# Patient Record
Sex: Female | Born: 1990 | Race: Black or African American | Hispanic: No | Marital: Single | Smoking: Never smoker
Health system: Southern US, Community
[De-identification: ages and names within clinical notes are randomized; demographics above are authoritative.]

## PROBLEM LIST (undated history)

## (undated) ENCOUNTER — Emergency Department: Admission: EM | Payer: BC Managed Care – PPO | Source: Home / Self Care

## (undated) ENCOUNTER — Emergency Department (HOSPITAL_COMMUNITY): Payer: Medicaid - Out of State | Source: Home / Self Care

## (undated) DIAGNOSIS — R55 Syncope and collapse: Secondary | ICD-10-CM

## (undated) DIAGNOSIS — J069 Acute upper respiratory infection, unspecified: Secondary | ICD-10-CM

## (undated) HISTORY — DX: Acute upper respiratory infection, unspecified: J06.9

## (undated) HISTORY — PX: BREAST BIOPSY: SHX20

---

## 2016-06-26 ENCOUNTER — Encounter (HOSPITAL_COMMUNITY): Payer: Self-pay

## 2016-06-26 ENCOUNTER — Emergency Department (HOSPITAL_COMMUNITY)
Admission: EM | Admit: 2016-06-26 | Discharge: 2016-06-26 | Disposition: A | Discharge status: Home / Self Care | Payer: Self-pay | Attending: Emergency Medicine | Admitting: Emergency Medicine

## 2016-06-26 DIAGNOSIS — J0110 Acute frontal sinusitis, unspecified: Secondary | ICD-10-CM | POA: Insufficient documentation

## 2016-06-26 HISTORY — DX: Syncope and collapse: R55

## 2016-06-26 MED ORDER — AMOXICILLIN-POT CLAVULANATE 875-125 MG PO TABS: 1.0000 | ORAL_TABLET | Freq: Two times a day (BID) | ORAL | 0 refills | Status: DC

## 2016-06-26 NOTE — Discharge Instructions (Signed)
Follow up with your family doc.  Return for fever, neck pain.

## 2016-06-26 NOTE — ED Triage Notes (Signed)
Pt with headache x 4 days.  Blurred vision and cranial pressure.  States she has episodes in her hx of syncope related to blood pressure being low.  This normally is followed by her headache.  Denies syncopal episode with this event.  States she has been told with any of these symptoms to be seen at a provider.

## 2016-06-26 NOTE — ED Provider Notes (Signed)
[facility]-EMERGENCY DEPT Provider Note   CSN: [id number] Arrival date & time: 06/26/16  1003     History   Chief Complaint Chief Complaint  Patient presents with  . Headache  . Blurred Vision    HPI Lisa Bray is a 26 y.o. female.  26 yo F with headache.  Going on for past week.  Mild change in vision that she describes as blurry. No difficulty reading, driving. Denies head injury, denies congestion, denies fevers, neck pain. Chewing makes this worse.     The history is provided by the patient.  Headache   This is a new problem. The current episode started more than 1 week ago. The problem occurs constantly. The problem has been gradually worsening. The headache is associated with eating. The pain is located in the bilateral region. The pain is at a severity of 6/10. The pain is moderate. The pain does not radiate. Pertinent negatives include no fever, no palpitations, no shortness of breath, no nausea and no vomiting. She has tried nothing for the symptoms. The treatment provided no relief.    Past Medical History:  Diagnosis Date  . Neurogenic syncope     There are no active problems to display for this patient.   History reviewed. No pertinent surgical history.  OB History    No data available       Home Medications    Prior to Admission medications   Medication Sig Start Date End Date Taking? Authorizing Provider  Multiple Vitamins-Minerals (MULTIVITAMIN ADULT PO) Take 1 tablet by mouth daily.   Yes Historical Provider, MD  tretinoin (RETIN-A) 0.025 % cream Apply 1 application topically at bedtime.   Yes Historical Provider, MD    Family History History reviewed. No pertinent family history.  Social History Social History  Substance Use Topics  . Smoking status: Never Smoker  . Smokeless tobacco: Never Used  . Alcohol use No     Allergies   Patient has no known allergies.   Review of Systems Review of Systems  Constitutional: Negative for chills  and fever.  HENT: Negative for congestion and rhinorrhea.   Eyes: Positive for visual disturbance (mild blurring). Negative for redness.  Respiratory: Negative for shortness of breath and wheezing.   Cardiovascular: Negative for chest pain and palpitations.  Gastrointestinal: Negative for nausea and vomiting.  Genitourinary: Negative for dysuria and urgency.  Musculoskeletal: Negative for arthralgias and myalgias.  Skin: Negative for pallor and wound.  Neurological: Positive for headaches. Negative for dizziness.     Physical Exam Updated Vital Signs BP 123/87 (BP Location: Left Arm)   Pulse 87   Temp 98.1 F (36.7 C) (Oral)   Resp 16   LMP 06/11/2016   SpO2 100%   Physical Exam  Constitutional: She is oriented to person, place, and time. She appears well-developed and well-nourished. No distress.  HENT:  Head: Normocephalic and atraumatic.  Swollen turbinates, L frontal sinus tenderness.  Pain to bilateral TMJ.  Eyes: EOM are normal. Pupils are equal, round, and reactive to light.  Neck: Normal range of motion. Neck supple.  Cardiovascular: Normal rate and regular rhythm.  Exam reveals no gallop and no friction rub.   No murmur heard. Pulmonary/Chest: Effort normal. She has no wheezes. She has no rales.  Abdominal: Soft. She exhibits no distension and no mass. There is no tenderness. There is no guarding.  Musculoskeletal: She exhibits no edema or tenderness.  Neurological: She is alert and oriented to person, place, and time. She  has normal strength. No cranial nerve deficit or sensory deficit. She displays a negative Romberg sign. Coordination and gait normal. GCS eye subscore is 4. GCS verbal subscore is 5. GCS motor subscore is 6. She displays no Babinski's sign on the right side. She displays no Babinski's sign on the left side.  Reflex Scores:      Tricep reflexes are 2+ on the right side and 2+ on the left side.      Bicep reflexes are 2+ on the right side and 2+ on the  left side.      Brachioradialis reflexes are 2+ on the right side and 2+ on the left side.      Patellar reflexes are 2+ on the right side and 2+ on the left side.      Achilles reflexes are 2+ on the right side and 2+ on the left side. Ambulates without difficulty  Skin: Skin is warm and dry. She is not diaphoretic.  Psychiatric: She has a normal mood and affect. Her behavior is normal.  Nursing note and vitals reviewed.    ED Treatments / Results  Labs (all labs ordered are listed, but only abnormal results are displayed) Labs Reviewed - No data to display  EKG  EKG Interpretation None       Radiology No results found.  Procedures Procedures (including critical care time)  Medications Ordered in ED Medications - No data to display   Initial Impression / Assessment and Plan / ED Course  I have reviewed the triage vital signs and the nursing notes.  Pertinent labs & imaging results that were available during my care of the patient were reviewed by me and considered in my medical decision making (see chart for details).  Clinical Course     26 yo F with a cc of headache.  Similar to prior, no red flags, benign neuro exam.  Offered migraine cocktail and declining.  Declining pain meds in ED.  TTP about the L frontal sinus with sinus congestion.  Suspect sinusititis also with TMJ pain and worse with eating.  NSAIDs PCP follow up.   11:01 AM:  I have discussed the diagnosis/risks/treatment options with the patient and family and believe the pt to be eligible for discharge home to follow-up with PCP. We also discussed returning to the ED immediately if new or worsening sx occur. We discussed the sx which are most concerning (e.g., sudden worsening pain, fever, inability to tolerate by mouth) that necessitate immediate return. Medications administered to the patient during their visit and any new prescriptions provided to the patient are listed below.  Medications given during  this visit Medications - No data to display   The patient appears reasonably screen and/or stabilized for discharge and I doubt any other medical condition or other [facility] requiring further screening, evaluation, or treatment in the ED at this time prior to discharge.    Final Clinical Impressions(s) / ED Diagnoses   Final diagnoses:  Acute frontal sinusitis, recurrence not specified    New Prescriptions New Prescriptions   No medications on file     Melene Plan, DO 06/26/16 1435

## 2016-07-09 ENCOUNTER — Ambulatory Visit: Payer: BLUE CROSS/BLUE SHIELD | Admitting: Neurology

## 2016-07-29 ENCOUNTER — Encounter: Payer: Self-pay | Admitting: Neurology

## 2016-07-29 ENCOUNTER — Ambulatory Visit (INDEPENDENT_AMBULATORY_CARE_PROVIDER_SITE_OTHER): Payer: Managed Care, Other (non HMO) | Admitting: Neurology

## 2016-07-29 VITALS — Ht 66.0 in | Wt 173.4 lb

## 2016-07-29 DIAGNOSIS — G8929 Other chronic pain: Secondary | ICD-10-CM

## 2016-07-29 DIAGNOSIS — R42 Dizziness and giddiness: Secondary | ICD-10-CM | POA: Diagnosis not present

## 2016-07-29 DIAGNOSIS — R51 Headache with orthostatic component, not elsewhere classified: Secondary | ICD-10-CM

## 2016-07-29 DIAGNOSIS — H539 Unspecified visual disturbance: Secondary | ICD-10-CM | POA: Diagnosis not present

## 2016-07-29 DIAGNOSIS — H5710 Ocular pain, unspecified eye: Secondary | ICD-10-CM | POA: Diagnosis not present

## 2016-07-29 DIAGNOSIS — R519 Headache, unspecified: Secondary | ICD-10-CM

## 2016-07-29 NOTE — Progress Notes (Signed)
[facility]    Provider:  Dr Lucia GaskinsAhern Referring Provider: Assunta FoundSullivan, Emily B, [place] Primary Care Physician:  Assunta FoundSullivan, Emily B, [place]   CC:  Headache  HPI:  Lisa Bray is a 26 y.o. female here as a referral from Dr. Lendell CapriceSullivan for headaches. Started having headaches a month ago and has had severe headache for 2-3 weeks intractable, ibuprofen, tylenol did not help, with associated imbalance, vision changes, eye pain. .She has a hx of headache in the past in 2012 associated with neurogenic syncope and was following with cardiology, diet change helped. This is a completely different headache than in the past. This headaches is more pressure all around the head, pressure behind, head symmetric bilateral but did start on the left and mostly pressure and pain in both eyes. The headaches lasts all day without relief. She was treated for sinus infection but the headaches didn't improve. No inciting event or head trauma. Continuous for weeks. Can be a 10/10 in pain. No light sensitivity, sound sensitivity or nausea or vomiting. Endorses Blurry vision and imbalance. No numbness, tingling, weakness. Nothing has helped, no known triggers.  No neck pain but has back pressure. She didn;t wear a bra for two week due to the back pains. No different activity and no different foods. No stress. She wakes up with headache daily. No other focal neurologic deficits, associated symptoms, inciting events or modifiable factors.  Reviewed notes, labs and imaging from outside physicians, which showed:  Patient was seen in the emergency room for headache, ER prescribed amoxicillin 875 mg. Visual acuity 20/20 left and 20/20 right. She describes the worst headache of her life for 1-2 weeks acute onset, dull with pressure no alleviating factors, no tenderness head or scalp, globally around the head, constant pain without radiation with a 6 out of 10 pain history. She was treated for sinusitis with amoxicillin at the  emergency room when she presented for headache. After completing her course sinus pain pressure has improved but the headache is unchanged. In the thoracic spine she feels pressure like something is pressing on it. Range of motion of the neck is normal without pain on flexion or extension. She describes pounding behind the eyes but also states that the headache is global. She did taking 400 mg of ibuprofen without relief. She was similar headache in 2011 she was diagnosed with neurogenic syncope by a cardiologist and had tilt testing done. No nausea vomiting, no numbness or tingling. Only current medication is topical Retin-A. Patient appeared well exam. Pupils were equally round and reactive, extraocular movements intact, funduscopic exam was normal without abnormalities, no sinus tenderness, tympanic membranes and ear normal, cranial nerves intact, neck was supple, muscle strength was normal and deep tendon reflexes were symmetric, normal sensory and gait. She also described balance difficulty, difficulty concentration and mild blurry vision with the headaches.  Reviewed notes from 2012 patient presented to neurology for evaluation of syncope. She had been having syncope for a year and half at that time. 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 She had been following with cardiology and they discussed a beta blocker with her that this medication was not started. She also has a history of headaches about 2 years prior to her experiencing the syncope. No aura. Located in the bilateral temporal areas characterizes pressure and moderate in intensity. She also reported associated photophobia and headache lasting 30-45 minutes 3-4 times per week. They discussed starting a  beta blocker for headache prophylaxis I would also help with her neurocardiogenic syncope the patient at the time decline. They did start magnesium 400 mg and riboflavin  200 mg daily for headache prophylaxis. I also discussed Imitrex for abortive therapy but patient declined.  Review of Systems: Patient complains of symptoms per HPI as well as the following symptoms:no cp. No sob. Pertinent negatives per HPI. All others negative.   Social History   Social History  . Marital status: Single    Spouse name: N/A  . Number of children: 0  . Years of education: BS   Occupational History  . [employer]    Social History Main Topics  . Smoking status: Never Smoker  . Smokeless tobacco: Never Used  . Alcohol use No  . Drug use: No  . Sexual activity: Not on file   Other Topics Concern  . Not on file   Social History Narrative   Lives at home, single   Right-handed   No caffeine    Family History  Problem Relation Age of Onset  . Hypertension Father   . Glaucoma Father   . Migraines Neg Hx     Past Medical History:  Diagnosis Date  . Neurogenic syncope     Past Surgical History:  Procedure Laterality Date  . BREAST BIOPSY Right     Current Outpatient Prescriptions  Medication Sig Dispense Refill  . ibuprofen (ADVIL,MOTRIN) 200 MG tablet Take 200 mg by mouth every 8 (eight) hours as needed.    . Multiple Vitamins-Minerals (MULTIVITAMIN ADULT PO) Take 1 tablet by mouth daily.    Marland Kitchen tretinoin (RETIN-A) 0.025 % cream Apply 1 application topically at bedtime.     No current facility-administered medications for this visit.     Allergies as of 07/29/2016  . (No Known Allergies)    Vitals: Ht 5\' 6"  (1.676 m)   Wt 173 lb 6.4 oz (78.7 kg)   BMI 27.99 kg/m  Last Weight:  Wt Readings from Last 1 Encounters:  07/29/16 173 lb 6.4 oz (78.7 kg)   Last Height:   Ht Readings from Last 1 Encounters:  07/29/16 5\' 6"  (1.676 m)   Physical exam: Exam: Gen: NAD, conversant, well nourised, well groomed                     CV: RRR, no MRG. No Carotid Bruits. No peripheral edema, warm, nontender Eyes: Conjunctivae clear without exudates or  hemorrhage  Neuro: Detailed Neurologic Exam  Speech:    Speech is normal; fluent and spontaneous with normal comprehension.  Cognition:    The patient is oriented to person, place, and time;     recent and remote memory intact;     language fluent;     normal attention, concentration,     fund of knowledge Cranial Nerves:    The pupils are equal, round, and reactive to light. The fundi are normal and spontaneous venous pulsations are present. Visual fields are full to finger confrontation. Extraocular movements are intact. Trigeminal sensation is intact and the muscles of mastication are normal. The face is symmetric. The palate elevates in the midline. Hearing intact. Voice is normal. Shoulder shrug is normal. The tongue has normal motion without fasciculations.   Coordination:    Normal finger to nose and heel to shin. Normal rapid alternating movements.   Gait:    Heel-toe and tandem gait are normal.   Motor Observation:    No asymmetry, no atrophy, and no  involuntary movements noted. Tone:    Normal muscle tone.    Posture:    Posture is normal. normal erect    Strength:    Strength is V/V in the upper and lower limbs.      Sensation: intact to LT     Reflex Exam:  DTR's:    Deep tendon reflexes in the upper and lower extremities are normal bilaterally.   Toes:    The toes are downgoing bilaterally.   Clonus:    Clonus is absent.       Assessment/Plan:  26 year old with acute onset intractable headache with vision changes, dizziness, wakes he rin the morning  - MRI of the brain w/wo contrast to evaluate for intracranial etiologies such as space-occupying lesion especially given morning headaches.  Discussed the following: To prevent or relieve headaches, try the following: Cool Compress. Lie down and place a cool compress on your head.  Avoid headache triggers. If certain foods or odors seem to have triggered your migraines in the past, avoid them. A headache  diary might help you identify triggers.  Include physical activity in your daily routine. Try a daily walk or other moderate aerobic exercise.  Manage stress. Find healthy ways to cope with the stressors, such as delegating tasks on your to-do list.  Practice relaxation techniques. Try deep breathing, yoga, massage and visualization.  Eat regularly. Eating regularly scheduled meals and maintaining a healthy diet might help prevent headaches. Also, drink plenty of fluids.  Follow a regular sleep schedule. Sleep deprivation might contribute to headaches Consider biofeedback. With this mind-body technique, you learn to control certain bodily functions - such as muscle tension, heart rate and blood pressure - to prevent headaches or reduce headache pain.    Proceed to emergency room if you experience new or worsening symptoms or symptoms do not resolve, if you have new neurologic symptoms or if headache is severe, or for any concerning symptom.   Cc: Assunta Found, [place]   Naomie Dean, MD  [facility] [address] [place], [place] [address]  Phone [phone] Fax [phone]

## 2016-07-29 NOTE — Patient Instructions (Addendum)
Remember to drink plenty of fluid, eat healthy meals and do not skip any meals. Try to eat protein with a every meal and eat a healthy snack such as fruit or nuts in between meals. Try to keep a regular sleep-wake schedule and try to exercise daily, particularly in the form of walking, 20-30 minutes a day, if you can.   As far as diagnostic testing: MRI brain w/wo contrast  I would like to see you back as needed, sooner if we need to. Please call us with any interim questions, concerns, problems, updates or refill requests.   Our phone number is [phone]. We also have an after hours call service for urgent matters and there is a physician on-call for urgent questions. For any emergencies you know to call 911 or go to the nearest emergency room

## 2016-08-26 ENCOUNTER — Other Ambulatory Visit: Payer: Medicaid - Out of State

## 2016-09-07 ENCOUNTER — Other Ambulatory Visit: Payer: Medicaid - Out of State

## 2016-09-19 ENCOUNTER — Emergency Department (HOSPITAL_COMMUNITY)
Admission: EM | Admit: 2016-09-19 | Discharge: 2016-09-19 | Disposition: A | Discharge status: Home / Self Care | Payer: Managed Care, Other (non HMO) | Attending: Emergency Medicine | Admitting: Emergency Medicine

## 2016-09-19 ENCOUNTER — Emergency Department (HOSPITAL_COMMUNITY): Payer: Managed Care, Other (non HMO)

## 2016-09-19 ENCOUNTER — Encounter (HOSPITAL_COMMUNITY): Payer: Self-pay

## 2016-09-19 DIAGNOSIS — J40 Bronchitis, not specified as acute or chronic: Secondary | ICD-10-CM | POA: Diagnosis not present

## 2016-09-19 DIAGNOSIS — R0981 Nasal congestion: Secondary | ICD-10-CM | POA: Diagnosis present

## 2016-09-19 MED ORDER — AZITHROMYCIN 250 MG PO TABS: ORAL_TABLET | ORAL | 0 refills | Status: DC

## 2016-09-19 MED ORDER — FLUTICASONE PROPIONATE 50 MCG/ACT NA SUSP: 2.0000 | Freq: Every day | NASAL | 0 refills | Status: DC

## 2016-09-19 MED ORDER — LORATADINE 10 MG PO TABS: 10.0000 mg | ORAL_TABLET | Freq: Every day | ORAL | 0 refills | Status: DC

## 2016-09-19 MED ORDER — BENZONATATE 100 MG PO CAPS: 100.0000 mg | ORAL_CAPSULE | Freq: Three times a day (TID) | ORAL | 0 refills | Status: DC

## 2016-09-19 NOTE — ED Provider Notes (Signed)
[facility]-EMERGENCY DEPT Provider Note   CSN: [id number] Arrival date & time: 09/19/16  4098     History   Chief Complaint Chief Complaint  Patient presents with  . URI    HPI Lisa Bray is a 26 y.o. female.  HPI Patient presents with 2 weeks of nasal congestion, sinus pressure, productive cough of yellow sputum and sore throat. Denies fever or chills. Has had fatigue and myalgias that this is improved. She been taking over-the-counter cold medication with little improvement. Denies any shortness of breath or chest pain. No new lower extremity swelling or pain. Past Medical History:  Diagnosis Date  . Neurogenic syncope     There are no active problems to display for this patient.   Past Surgical History:  Procedure Laterality Date  . BREAST BIOPSY Right     OB History    No data available       Home Medications    Prior to Admission medications   Medication Sig Start Date End Date Taking? Authorizing Provider  azithromycin (ZITHROMAX Z-PAK) 250 MG tablet 2 po day one, then 1 daily x 4 days 09/19/16   Loren Racer, MD  benzonatate (TESSALON) 100 MG capsule Take 1 capsule (100 mg total) by mouth every 8 (eight) hours. 09/19/16   Loren Racer, MD  fluticasone (FLONASE) 50 MCG/ACT nasal spray Place 2 sprays into both nostrils daily. 09/19/16   Loren Racer, MD  ibuprofen (ADVIL,MOTRIN) 200 MG tablet Take 200 mg by mouth every 8 (eight) hours as needed.    Historical Provider, MD  loratadine (CLARITIN) 10 MG tablet Take 1 tablet (10 mg total) by mouth daily. 09/19/16   Loren Racer, MD  Multiple Vitamins-Minerals (MULTIVITAMIN ADULT PO) Take 1 tablet by mouth daily.    Historical Provider, MD  tretinoin (RETIN-A) 0.025 % cream Apply 1 application topically at bedtime.    Historical Provider, MD    Family History Family History  Problem Relation Age of Onset  . Hypertension Father   . Glaucoma Father   . Migraines Neg Hx     Social History Social  History  Substance Use Topics  . Smoking status: Never Smoker  . Smokeless tobacco: Never Used  . Alcohol use No     Allergies   Patient has no known allergies.   Review of Systems Review of Systems  Constitutional: Positive for fatigue. Negative for chills and fever.  HENT: Positive for congestion, rhinorrhea, sinus pressure and sore throat. Negative for trouble swallowing and voice change.   Eyes: Positive for discharge. Negative for visual disturbance.  Respiratory: Positive for cough. Negative for chest tightness and shortness of breath.   Cardiovascular: Negative for chest pain, palpitations and leg swelling.  Gastrointestinal: Negative for abdominal pain, constipation, diarrhea and nausea.  Musculoskeletal: Positive for myalgias. Negative for back pain, neck pain and neck stiffness.  Skin: Negative for rash and wound.  Neurological: Positive for headaches. Negative for dizziness, weakness, light-headedness and numbness.  All other systems reviewed and are negative.    Physical Exam Updated Vital Signs BP 127/87 (BP Location: Left Arm)   Pulse 85   Temp 97.7 F (36.5 C) (Oral)   Resp 16   LMP 09/15/2016 (Exact Date)   SpO2 100%   Physical Exam  Constitutional: She is oriented to person, place, and time. She appears well-developed and well-nourished. No distress.  HENT:  Head: Normocephalic and atraumatic.  Mouth/Throat: Oropharynx is clear and moist.  Bilateral nasal mucosal edema. No sinus tenderness to percussion.  Mildly erythematous oropharynx. No tonsillar exudates. Uvula is midline.  Eyes: EOM are normal. Pupils are equal, round, and reactive to light.  Neck: Normal range of motion. Neck supple.  No meningismus  Cardiovascular: Normal rate and regular rhythm.  Exam reveals no gallop and no friction rub.   No murmur heard. Pulmonary/Chest: Effort normal and breath sounds normal. No respiratory distress. She has no wheezes. She has no rales. She exhibits no  tenderness.  Abdominal: Soft. Bowel sounds are normal. There is no tenderness. There is no rebound and no guarding.  Musculoskeletal: Normal range of motion. She exhibits no edema or tenderness.  No lower extremity swelling or asymmetry.  Lymphadenopathy:    She has no cervical adenopathy.  Neurological: She is alert and oriented to person, place, and time.  Skin: Skin is warm and dry. Capillary refill takes less than 2 seconds. No rash noted. No erythema.  Psychiatric: She has a normal mood and affect. Her behavior is normal.  Nursing note and vitals reviewed.    ED Treatments / Results  Labs (all labs ordered are listed, but only abnormal results are displayed) Labs Reviewed - No data to display  EKG  EKG Interpretation None       Radiology Dg Chest 2 View  Result Date: 09/19/2016 CLINICAL DATA:  Productive cough for less than a week EXAM: CHEST  2 VIEW COMPARISON:  None. FINDINGS: The heart size and mediastinal contours are within normal limits. Both lungs are clear. The visualized skeletal structures are unremarkable. IMPRESSION: No active cardiopulmonary disease. Electronically Signed   By: Elige Ko   On: 09/19/2016 09:28    Procedures Procedures (including critical care time)  Medications Ordered in ED Medications - No data to display   Initial Impression / Assessment and Plan / ED Course  I have reviewed the triage vital signs and the nursing notes.  Pertinent labs & imaging results that were available during my care of the patient were reviewed by me and considered in my medical decision making (see chart for details).    Chest x-ray is negative. Will treat for sinusitis/bronchitis. Return precautions given.   Final Clinical Impressions(s) / ED Diagnoses   Final diagnoses:  Nasal sinus congestion  Bronchitis    New Prescriptions New Prescriptions   AZITHROMYCIN (ZITHROMAX Z-PAK) 250 MG TABLET    2 po day one, then 1 daily x 4 days   BENZONATATE  (TESSALON) 100 MG CAPSULE    Take 1 capsule (100 mg total) by mouth every 8 (eight) hours.   FLUTICASONE (FLONASE) 50 MCG/ACT NASAL SPRAY    Place 2 sprays into both nostrils daily.   LORATADINE (CLARITIN) 10 MG TABLET    Take 1 tablet (10 mg total) by mouth daily.     Loren Racer, MD 09/19/16 [phone]

## 2016-09-19 NOTE — ED Notes (Signed)
Patient is A & O x4.  She understood AVS instructions.  

## 2016-09-19 NOTE — ED Triage Notes (Signed)
She c/o uri sx x 2 weeks "It just won't go away". She is in no distress.

## 2016-09-23 ENCOUNTER — Ambulatory Visit
Admission: RE | Admit: 2016-09-23 | Discharge: 2016-09-23 | Disposition: A | Discharge status: Home / Self Care | Payer: Managed Care, Other (non HMO) | Source: Ambulatory Visit | Attending: Neurology | Admitting: Neurology

## 2016-09-23 DIAGNOSIS — R42 Dizziness and giddiness: Secondary | ICD-10-CM

## 2016-09-23 DIAGNOSIS — R51 Headache with orthostatic component, not elsewhere classified: Secondary | ICD-10-CM

## 2016-09-23 DIAGNOSIS — H539 Unspecified visual disturbance: Secondary | ICD-10-CM

## 2016-09-23 DIAGNOSIS — G8929 Other chronic pain: Secondary | ICD-10-CM

## 2016-09-23 DIAGNOSIS — H5710 Ocular pain, unspecified eye: Secondary | ICD-10-CM

## 2016-09-23 DIAGNOSIS — R519 Headache, unspecified: Secondary | ICD-10-CM

## 2016-09-23 MED ORDER — GADOBENATE DIMEGLUMINE 529 MG/ML IV SOLN
16.0000 mL | Freq: Once | INTRAVENOUS | Status: AC | PRN
  Administered 2016-09-23: 16 mL via INTRAVENOUS

## 2016-09-28 ENCOUNTER — Telehealth: Payer: Self-pay

## 2016-09-28 NOTE — Telephone Encounter (Signed)
-----   Message from Anson Fret, MD sent at 09/28/2016  9:34 AM EDT ----- MRI brain is unremarkable thanks

## 2016-09-28 NOTE — Telephone Encounter (Signed)
Called pt w/ unremarkable MRI results. Verbalized understanding and appreciation for call. 

## 2017-04-11 ENCOUNTER — Emergency Department (HOSPITAL_COMMUNITY)
Admission: EM | Admit: 2017-04-11 | Discharge: 2017-04-11 | Disposition: A | Discharge status: Home / Self Care | Payer: Managed Care, Other (non HMO) | Attending: Emergency Medicine | Admitting: Emergency Medicine

## 2017-04-11 ENCOUNTER — Encounter (HOSPITAL_COMMUNITY): Payer: Self-pay | Admitting: Emergency Medicine

## 2017-04-11 ENCOUNTER — Emergency Department (HOSPITAL_COMMUNITY): Payer: Managed Care, Other (non HMO)

## 2017-04-11 DIAGNOSIS — K5900 Constipation, unspecified: Secondary | ICD-10-CM

## 2017-04-11 DIAGNOSIS — R110 Nausea: Secondary | ICD-10-CM | POA: Diagnosis present

## 2017-04-11 DIAGNOSIS — Z79899 Other long term (current) drug therapy: Secondary | ICD-10-CM | POA: Diagnosis not present

## 2017-04-11 LAB — COMPREHENSIVE METABOLIC PANEL
ALBUMIN: 3.9 g/dL (ref 3.5–5.0)
ALT: 12 U/L — ABNORMAL LOW (ref 14–54)
ANION GAP: 6 (ref 5–15)
AST: 22 U/L (ref 15–41)
Alkaline Phosphatase: 83 U/L (ref 38–126)
BILIRUBIN TOTAL: 0.5 mg/dL (ref 0.3–1.2)
BUN: 7 mg/dL (ref 6–20)
CO2: 29 mmol/L (ref 22–32)
Calcium: 9.4 mg/dL (ref 8.9–10.3)
Chloride: 104 mmol/L (ref 101–111)
Creatinine, Ser: 0.64 mg/dL (ref 0.44–1.00)
GFR calc Af Amer: >60 mL/min (ref >60)
GFR calc non Af Amer: >60 mL/min (ref >60)
GLUCOSE: 86 mg/dL (ref 65–99)
Potassium: 3.9 mmol/L (ref 3.5–5.1)
SODIUM: 139 mmol/L (ref 135–145)
TOTAL PROTEIN: 7.6 g/dL (ref 6.5–8.1)

## 2017-04-11 LAB — CBC
HEMATOCRIT: 35.1 % — AB (ref 36.0–46.0)
HEMOGLOBIN: 12.4 g/dL (ref 12.0–15.0)
MCH: 26.6 pg (ref 26.0–34.0)
MCHC: 35.3 g/dL (ref 30.0–36.0)
MCV: 75.2 fL — ABNORMAL LOW (ref 78.0–100.0)
Platelets: 196 10*3/uL (ref 150–400)
RBC: 4.67 MIL/uL (ref 3.87–5.11)
RDW: 15 % (ref 11.5–15.5)
WBC: 6.5 10*3/uL (ref 4.0–10.5)

## 2017-04-11 LAB — LIPASE, BLOOD: Lipase: 29 U/L (ref 11–51)

## 2017-04-11 LAB — URINALYSIS, ROUTINE W REFLEX MICROSCOPIC
BILIRUBIN URINE: NEGATIVE
GLUCOSE, UA: NEGATIVE mg/dL
HGB URINE DIPSTICK: NEGATIVE
Ketones, ur: NEGATIVE mg/dL
Leukocytes, UA: NEGATIVE
NITRITE: NEGATIVE
PH: 9 — AB (ref 5.0–8.0)
Protein, ur: NEGATIVE mg/dL
Specific Gravity, Urine: 1.005 (ref 1.005–1.030)

## 2017-04-11 LAB — POC URINE PREG, ED: Preg Test, Ur: NEGATIVE

## 2017-04-11 NOTE — Discharge Instructions (Signed)
Please take miralax twice a day until you have normal bowel movements.  Please follow up with your primary care provider regarding your symptoms.  I have given you instructions on high fiber diet to help your constipation.  You may also take magnesium citrate if you desire a stronger laxative, however this will most likely cause increased cramping.  Also consider using an enema as this can help constipation.   Your labs today were reassuring and your x-ray showed a moderate amount of stool in your large intestine.    If you have any concerns, developed abdominal pain not related to laxative use, or fevers please return to the emergency room for repeat evaluation.

## 2017-04-11 NOTE — ED Triage Notes (Signed)
Pt reports she has had nausea and hard BMs for the past several days. Went to an UC for this and was prescribed an antiemetic and told to take miralax. Started miralax, but did not pick up prescription. Pt reports her symptoms are the same.

## 2017-04-11 NOTE — ED Provider Notes (Signed)
[facility]-EMERGENCY DEPT Provider Note   CSN: [id number] Arrival date & time: 04/11/17  1239     History   Chief Complaint Chief Complaint  Patient presents with  . Nausea  . Constipation    HPI Lisa Bray is a 26 y.o. female with a history of neurogenic syncope and migraines who presents for evaluation of nausea and hard BM for the past few days.  She went to urgent care on friday for this and was started on MiraLAX once daily. She reports that she has had 3 doses of MiraLAX so far. She is still having bowel movements however she reports that they are small and consult consistent with "rabbit pellets."  She is still passing gas. She denies any abdominal pain. She does have intermittent nausea however did not fill her nausea medication prescription from urgent care and says that she does not want any medicine for this. She denies any recent dietary changes. No history of abdominal or pelvic surgeries. No history of pregnancy. She denies any vaginal or urinary symptoms.  HPI  Past Medical History:  Diagnosis Date  . Neurogenic syncope     There are no active problems to display for this patient.   Past Surgical History:  Procedure Laterality Date  . BREAST BIOPSY Right     OB History    No data available       Home Medications    Prior to Admission medications   Medication Sig Start Date End Date Taking? Authorizing Provider  azithromycin (ZITHROMAX Z-PAK) 250 MG tablet 2 po day one, then 1 daily x 4 days 09/19/16   Lisa RacerYelverton, David, MD  benzonatate (TESSALON) 100 MG capsule Take 1 capsule (100 mg total) by mouth every 8 (eight) hours. 09/19/16   Lisa RacerYelverton, David, MD  fluticasone (FLONASE) 50 MCG/ACT nasal spray Place 2 sprays into both nostrils daily. 09/19/16   Lisa RacerYelverton, David, MD  ibuprofen (ADVIL,MOTRIN) 200 MG tablet Take 200 mg by mouth every 8 (eight) hours as needed.    [provider]  loratadine (CLARITIN) 10 MG tablet Take  1 tablet (10 mg total) by mouth daily. 09/19/16   Lisa RacerYelverton, David, MD  Multiple Vitamins-Minerals (MULTIVITAMIN ADULT PO) Take 1 tablet by mouth daily.    [provider]  tretinoin (RETIN-A) 0.025 % cream Apply 1 application topically at bedtime.    [provider]    Family History Family History  Problem Relation Age of Onset  . Hypertension Father   . Glaucoma Father   . Migraines Neg Hx     Social History Social History  Substance Use Topics  . Smoking status: Never Smoker  . Smokeless tobacco: Never Used  . Alcohol use No     Allergies   Patient has no known allergies.   Review of Systems Review of Systems  Constitutional: Negative for chills and fever.  HENT: Negative for ear pain and sore throat.   Eyes: Negative for pain and visual disturbance.  Respiratory: Negative for cough and shortness of breath.   Cardiovascular: Negative for chest pain and palpitations.  Gastrointestinal: Positive for constipation and nausea. Negative for abdominal distention, abdominal pain, blood in stool, rectal pain and vomiting.  Genitourinary: Negative for dysuria and hematuria.  Musculoskeletal: Negative for arthralgias and back pain.  Skin: Negative for color change and rash.  Neurological: Negative for seizures and syncope.  All other systems reviewed and are negative.    Physical Exam Updated Vital Signs BP 108/82 (BP Location: Left  Arm)   Pulse 71   Temp 98.1 F (36.7 C) (Oral)   Resp 13   Ht 5\' 6"  (1.676 m)   Wt 80.3 kg (177 lb)   LMP 03/17/2017 Comment: - upreg 04/11/17  SpO2 99%   BMI 28.57 kg/m   Physical Exam  Constitutional: She appears well-developed and well-nourished. No distress.  HENT:  Head: Normocephalic and atraumatic.  Eyes: Conjunctivae are normal. Right eye exhibits no discharge. Left eye exhibits no discharge. No scleral icterus.  Neck: Normal range of motion.  Cardiovascular: Normal rate, regular rhythm and intact distal  pulses.   Pulmonary/Chest: Effort normal. No stridor. No respiratory distress.  Abdominal: Soft. Normal appearance and bowel sounds are normal. She exhibits no distension and no mass. There is no tenderness. There is no rigidity, no guarding and no CVA tenderness. No hernia.  Musculoskeletal: She exhibits no edema or deformity.  Neurological: She is alert. She exhibits normal muscle tone.  Skin: Skin is warm and dry. She is not diaphoretic.  Psychiatric: She has a normal mood and affect. Her behavior is normal.  Nursing note and vitals reviewed.    ED Treatments / Results  Labs (all labs ordered are listed, but only abnormal results are displayed) Labs Reviewed  COMPREHENSIVE METABOLIC PANEL - Abnormal; Notable for the following:       Result Value   ALT 12 (*)    All other components within normal limits  CBC - Abnormal; Notable for the following:    HCT 35.1 (*)    MCV 75.2 (*)    All other components within normal limits  URINALYSIS, ROUTINE W REFLEX MICROSCOPIC - Abnormal; Notable for the following:    Color, Urine STRAW (*)    pH 9.0 (*)    All other components within normal limits  LIPASE, BLOOD  POC URINE PREG, ED    EKG  EKG Interpretation None       Radiology Dg Abdomen 1 View  Result Date: 04/11/2017 CLINICAL DATA:  Nausea and constipation. EXAM: ABDOMEN - 1 VIEW COMPARISON:  None. FINDINGS: The bowel gas pattern is normal. Mild volume retained large bowel stool. No radio-opaque calculi or other significant radiographic abnormality are seen. Insert phleboliths. IMPRESSION: Moderate amount of retained large bowel stool, normal bowel gas pattern. Electronically Signed   By: Lisa Bray M.D.   On: 04/11/2017 16:36    Procedures Procedures (including critical care time)  Medications Ordered in ED Medications - No data to display   Initial Impression / Assessment and Plan / ED Course  I have reviewed the triage vital signs and the nursing  notes.  Pertinent labs & imaging results that were available during my care of the patient were reviewed by me and considered in my medical decision making (see chart for details).    Lisa Bray presents today for evaluation of nausea and constipation. She reports that her constipation began on Wednesday, was seen in urgent care on Friday, and started on once daily MiraLAX. She reports that she is still passing small amounts of stool and gas. KUB was obtained with out obvious obstruction, moderate retained stool.  Patient was instructed to increase miralax dosing to BID and on supportive care including hydration.  She was instructed she may take magnesium as needed however this may cause abdominal cramping.  She was given return precautions and states her understanding.  Patient was instructed to follow up with her PCP if symptoms persist to evaluate for underlying cause of her symptoms.  She has no abdominal pain and her abdomen is soft and non tender.  I do not feel like she needs additional work up at this time and the miralax needs additional time to work.    Final Clinical Impressions(s) / ED Diagnoses   Final diagnoses:  Constipation, unspecified constipation type  Nausea    New Prescriptions New Prescriptions   No medications on file     Norman Clay 04/11/17 1700    Shaune Pollack, MD 04/12/17 1114

## 2017-07-07 ENCOUNTER — Other Ambulatory Visit: Payer: Self-pay | Admitting: Family Medicine

## 2017-07-07 ENCOUNTER — Other Ambulatory Visit (HOSPITAL_COMMUNITY)
Admission: RE | Admit: 2017-07-07 | Discharge: 2017-07-07 | Disposition: A | Discharge status: Home / Self Care | Payer: Managed Care, Other (non HMO) | Source: Ambulatory Visit | Attending: Family Medicine | Admitting: Family Medicine

## 2017-07-07 DIAGNOSIS — Z01419 Encounter for gynecological examination (general) (routine) without abnormal findings: Secondary | ICD-10-CM | POA: Insufficient documentation

## 2017-07-09 LAB — CYTOLOGY - PAP: Diagnosis: NEGATIVE

## 2017-11-05 ENCOUNTER — Other Ambulatory Visit: Payer: Self-pay | Admitting: General Surgery

## 2017-12-25 ENCOUNTER — Encounter (HOSPITAL_COMMUNITY): Payer: Self-pay

## 2017-12-25 ENCOUNTER — Emergency Department (HOSPITAL_COMMUNITY)
Admission: EM | Admit: 2017-12-25 | Discharge: 2017-12-25 | Disposition: A | Discharge status: Home / Self Care | Payer: Managed Care, Other (non HMO) | Attending: Emergency Medicine | Admitting: Emergency Medicine

## 2017-12-25 ENCOUNTER — Other Ambulatory Visit: Payer: Self-pay

## 2017-12-25 DIAGNOSIS — T7840XA Allergy, unspecified, initial encounter: Secondary | ICD-10-CM

## 2017-12-25 DIAGNOSIS — Z79899 Other long term (current) drug therapy: Secondary | ICD-10-CM | POA: Diagnosis not present

## 2017-12-25 NOTE — ED Triage Notes (Signed)
She states that shortly after consuming a beverage today which contained carrots, sweet potato and watermelon her mouth felt "dry and weird; and it's a little hard to swallow". She denies any feeling of throat,, tongue or lip swelling. Her breathing is normal and her b.b.s. Are clear.

## 2017-12-25 NOTE — ED Provider Notes (Signed)
[facility]-EMERGENCY DEPT Provider Note  CSN: [id number] Arrival date & time: 12/25/17  1803    History   Chief Complaint Chief Complaint  Patient presents with  . Allergic Reaction    HPI Lisa Bray is a 27 y.o. female with a medical history of neurogenic syncope who presented to the ED for a possible allergic reaction. She states that she drank a homemade juice of raw carrot, sweet potato and watermelon. Shortly after consumption (~10 min), she describes her throat feeling dry, itchy and felt like it was hard to swallow. She took Benadryl 50mg  prior to coming to the ED. Denies difficulty breathing, SOB, chest pain, hives, abdominal pain and N/V. She reports environmental allergies to dust and pollen and denies food allergies.   Past Medical History:  Diagnosis Date  . Neurogenic syncope     There are no active problems to display for this patient.   Past Surgical History:  Procedure Laterality Date  . BREAST BIOPSY Right      OB History   None      Home Medications    Prior to Admission medications   Medication Sig Start Date End Date Taking? Authorizing Provider  azithromycin (ZITHROMAX Z-PAK) 250 MG tablet 2 po day one, then 1 daily x 4 days 09/19/16   Loren Racer, MD  benzonatate (TESSALON) 100 MG capsule Take 1 capsule (100 mg total) by mouth every 8 (eight) hours. 09/19/16   Loren Racer, MD  fluticasone (FLONASE) 50 MCG/ACT nasal spray Place 2 sprays into both nostrils daily. 09/19/16   Loren Racer, MD  ibuprofen (ADVIL,MOTRIN) 200 MG tablet Take 200 mg by mouth every 8 (eight) hours as needed.    [provider]  loratadine (CLARITIN) 10 MG tablet Take 1 tablet (10 mg total) by mouth daily. 09/19/16   Loren Racer, MD  Multiple Vitamins-Minerals (MULTIVITAMIN ADULT PO) Take 1 tablet by mouth daily.    [provider]  tretinoin (RETIN-A) 0.025 % cream Apply 1 application topically at bedtime.    [provider]    Family History Family History  Problem Relation Age of Onset  . Hypertension Father   . Glaucoma Father   . Migraines Neg Hx     Social History Social History   Tobacco Use  . Smoking status: Never Smoker  . Smokeless tobacco: Never Used  Substance Use Topics  . Alcohol use: No  . Drug use: No     Allergies   Tape   Review of Systems Review of Systems  Constitutional: Negative for activity change, appetite change and fever.  HENT: Positive for sore throat and trouble swallowing. Negative for voice change.   Eyes: Negative for itching and visual disturbance.  Respiratory: Negative for chest tightness and shortness of breath.   Cardiovascular: Negative for chest pain and palpitations.  Skin: Negative for rash.  Allergic/Immunologic: Positive for environmental allergies. Negative for food allergies.     Physical Exam Updated Vital Signs BP 128/78 (BP Location: Left Arm)   Pulse 70   Temp 98.9 F (37.2 C) (Oral)   Resp 11   LMP 12/22/2017 (Exact Date)   SpO2 100%   Physical Exam  Constitutional: She appears well-developed and well-nourished. No distress.  HENT:  Mouth/Throat: Uvula is midline, oropharynx is clear and moist and mucous membranes are normal. No oropharyngeal exudate, posterior oropharyngeal edema or posterior oropharyngeal erythema. No tonsillar exudate.  Eyes: Pupils are equal, round, and reactive to light. Conjunctivae and EOM are  normal.  Cardiovascular: Normal rate, regular rhythm and normal heart sounds.  Pulmonary/Chest: Effort normal and breath sounds normal.  Lymphadenopathy:    She has no cervical adenopathy.  Skin: Skin is warm and intact. Capillary refill takes less than 2 seconds. No rash noted.  Nursing note and vitals reviewed.    ED Treatments / Results  Labs (all labs ordered are listed, but only abnormal results are displayed) Labs Reviewed - No data to display  EKG None  Radiology No results  found.  Procedures Procedures (including critical care time)  Medications Ordered in ED Medications - No data to display   Initial Impression / Assessment and Plan / ED Course  Triage vital signs and the nursing notes have been reviewed.  Pertinent labs & imaging results that were available during care of the patient were reviewed and considered in medical decision making (see chart for details).   Patient presents approx. 1 hour after experiencing throat itching. She had taken 50mg  of Benadryl prior to arrival and her symptoms had resolved. Physical exam was normal in the ED which is reassuring. History and clinical presentation are not consistent with anaphylaxis or severe reaction that required IM epinephrine or IM/IV antihistamine.  Final Clinical Impressions(s) / ED Diagnoses  1. Allergic Reaction. Not anaphylaxis. Education provided on allergies, anaphylaxis and OTC/pharm therapies used to treat this and when to return to the ED.  Dispo: Home. After thorough clinical evaluation, this patient is determined to be medically stable and can be safely discharged with the previously mentioned treatment and/or outpatient follow-up/referral(s). At this time, there are no other apparent medical conditions that require further screening, evaluation or treatment.   Final diagnoses:  Allergic reaction, initial encounter    ED Discharge Orders    None        Reva BoresMortis, Gabrielle I, PA-C 12/25/17 2009    Arby BarrettePfeiffer, Marcy, MD 12/28/17 1231

## 2017-12-25 NOTE — Discharge Instructions (Addendum)
Avoid drinking that juice again until you can identify what caused this reaction.  Using Benadryl was the exact right thing to do. If something similar happens again, start with Benadryl and see if helps within 30-45 minutes.  If you have more than one or more of these allergic symptoms, then you shouldreturn to the emergency room: difficulty breathing/throat closing sensation, chest pain, shortness of breath, heart racing, sweating, hives, vomiting.

## 2018-10-18 IMAGING — CR DG CHEST 2V
2 series · 2 of 2 positions shown · non-contrast
Comparison: None.

CLINICAL DATA: Productive cough for less than a week

EXAM:
CHEST  2 VIEW

[w chest pa]
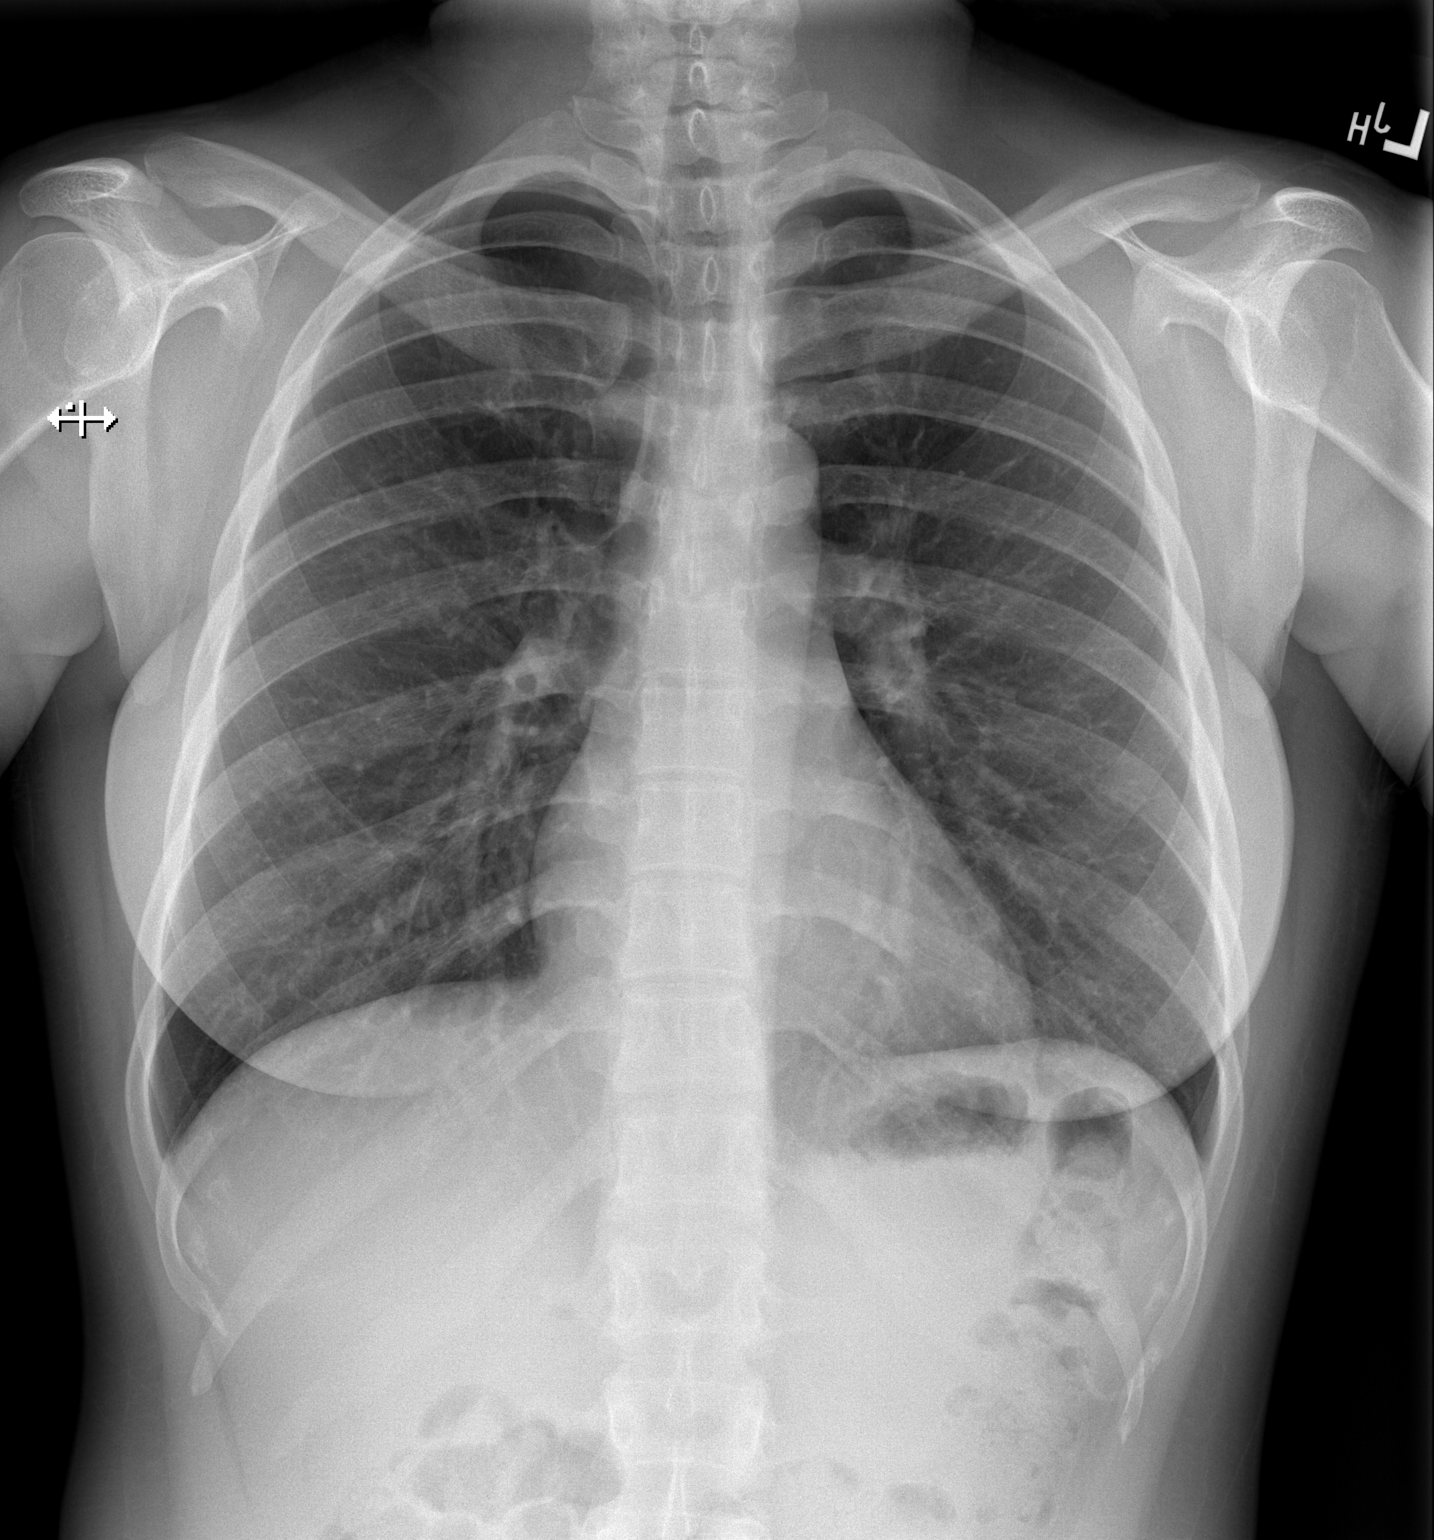

[w chest lat]
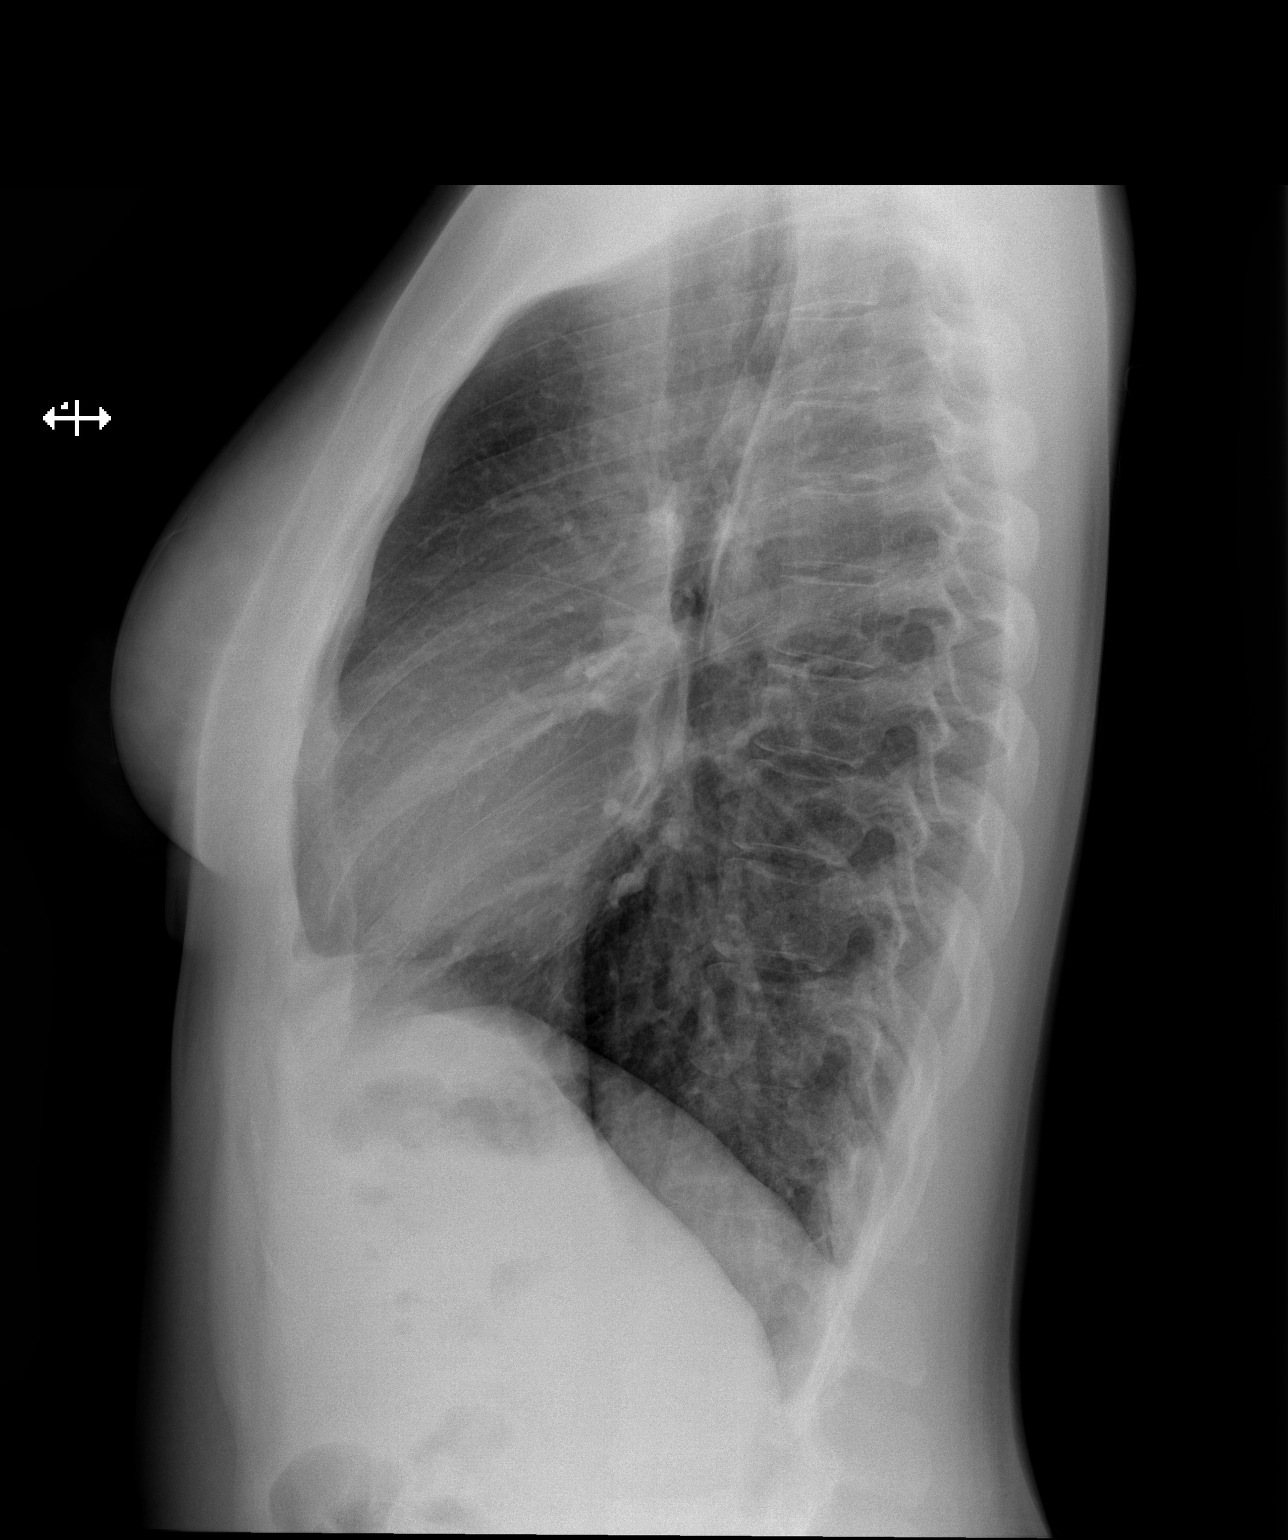

[2 of 2 positions shown; findings below may reference images not displayed]

FINDINGS: The heart size and mediastinal contours are within normal limits.
Both lungs are clear. The visualized skeletal structures are
unremarkable.
IMPRESSION: No active cardiopulmonary disease.

## 2018-12-26 ENCOUNTER — Encounter (HOSPITAL_COMMUNITY): Payer: Self-pay

## 2018-12-26 ENCOUNTER — Other Ambulatory Visit: Payer: Self-pay

## 2018-12-26 ENCOUNTER — Emergency Department (HOSPITAL_COMMUNITY): Payer: Managed Care, Other (non HMO)

## 2018-12-26 ENCOUNTER — Emergency Department (HOSPITAL_COMMUNITY)
Admission: EM | Admit: 2018-12-26 | Discharge: 2018-12-26 | Disposition: A | Discharge status: Home / Self Care | Payer: Managed Care, Other (non HMO) | Attending: Emergency Medicine | Admitting: Emergency Medicine

## 2018-12-26 DIAGNOSIS — M542 Cervicalgia: Secondary | ICD-10-CM | POA: Diagnosis not present

## 2018-12-26 DIAGNOSIS — Z79899 Other long term (current) drug therapy: Secondary | ICD-10-CM | POA: Diagnosis not present

## 2018-12-26 NOTE — Discharge Instructions (Addendum)
Please follow up with your PCP regarding your symptoms.  °

## 2018-12-26 NOTE — ED Triage Notes (Signed)
Patient c/o left lateral neck pain and mid and lower back pain x 3-4 days. Patient denies any heavy lifting or injury. Patient states  She has been waking up with a white tongue and "smelly" x 3-4 days. patient states it smells even after she brushes her teeth.

## 2018-12-26 NOTE — ED Provider Notes (Signed)
[facility] Provider Note   CSN: [id number] Arrival date & time: 12/26/18  1707    History   Chief Complaint Chief Complaint  Patient presents with   Neck Pain   Back Pain    HPI Lisa Bray is a 28 y.o. female who presents to the ED with complaint of left lateral neck pain and mid lower back pain x 3-4 days. Pt also complains of a foul taste in her mouth x 3-4 days as well; she reports when she wakes up her tongue looks white to her; after she brushes her teeth and tongue well the whiteness goes away. She reports that for the past 1.5 weeks she has been feeling "off." She had a headache earlier in the week with some dizziness and nausea; she reports that she was seen by her PCP and noted to have nystagmus at that point; pt was given zofran for nausea and treated for vertigo. She reports that her headache and nausea went away but afterwards she had body aches as well. She was seen by her PCP again via telemedicine for follow up and her PCP ordered an outpatient covid test which came back negative. Pt was told to follow up with her PCP if she did not feel any better but she never did. She states that her body aches have also resolved but now she is having this low back pain and neck pain. Denies fever, chills, chest pain, shortness of breath, sore throat, ear pain, abdominal pain, nausea, vomiting, urinary sx, vaginal sx, or any other associated symptoms. No known injury to pt's back or neck; no overuse injury. No dental pain or drainage from teeth. Pt sees a dentist regularly and last saw them < 6 months ago.        Past Medical History:  Diagnosis Date   Neurogenic syncope     There are no active problems to display for this patient.   Past Surgical History:  Procedure Laterality Date   BREAST BIOPSY Right      OB History   No obstetric history on file.      Home Medications    Prior to Admission medications   Medication Sig  Start Date End Date Taking? Authorizing Provider  azithromycin (ZITHROMAX Z-PAK) 250 MG tablet 2 po day one, then 1 daily x 4 days 09/19/16   Julianne Rice, MD  benzonatate (TESSALON) 100 MG capsule Take 1 capsule (100 mg total) by mouth every 8 (eight) hours. 09/19/16   Julianne Rice, MD  fluticasone (FLONASE) 50 MCG/ACT nasal spray Place 2 sprays into both nostrils daily. 09/19/16   Julianne Rice, MD  ibuprofen (ADVIL,MOTRIN) 200 MG tablet Take 200 mg by mouth every 8 (eight) hours as needed.    [provider]  loratadine (CLARITIN) 10 MG tablet Take 1 tablet (10 mg total) by mouth daily. 09/19/16   Julianne Rice, MD  Multiple Vitamins-Minerals (MULTIVITAMIN ADULT PO) Take 1 tablet by mouth daily.    [provider]  tretinoin (RETIN-A) 0.025 % cream Apply 1 application topically at bedtime.    [provider]    Family History Family History  Problem Relation Age of Onset   Hypertension Father    Glaucoma Father    Migraines Neg Hx     Social History Social History   Tobacco Use   Smoking status: Never Smoker   Smokeless tobacco: Never Used  Substance Use Topics   Alcohol use: No   Drug use: No  Allergies   Tape   Review of Systems Review of Systems  Constitutional: Negative for chills and fever.  HENT: Negative for congestion, ear pain, postnasal drip, rhinorrhea, sinus pressure, sinus pain, sore throat, trouble swallowing and voice change.   Eyes: Negative for visual disturbance.  Respiratory: Negative for shortness of breath.   Cardiovascular: Negative for chest pain.  Gastrointestinal: Negative for abdominal pain, constipation, diarrhea, nausea and vomiting.  Genitourinary: Negative for difficulty urinating.  Musculoskeletal: Positive for back pain and neck pain. Negative for gait problem, myalgias and neck stiffness.  Skin: Negative for rash.  Neurological: Negative for headaches.     Physical Exam Updated Vital  Signs BP 114/80 (BP Location: Right Arm)    Pulse 93    Temp 98.5 F (36.9 C) (Oral)    Resp 16    Ht 5\' 6"  (1.676 m)    Wt 71.7 kg    LMP 12/07/2018    SpO2 100%    BMI 25.50 kg/m   Physical Exam Vitals signs and nursing note reviewed.  Constitutional:      Appearance: She is not ill-appearing or diaphoretic.  HENT:     Head: Normocephalic and atraumatic.     Right Ear: Tympanic membrane normal.     Left Ear: Tympanic membrane normal.     Nose: Nose normal.     Mouth/Throat:     Mouth: Mucous membranes are moist.     Pharynx: No oropharyngeal exudate or posterior oropharyngeal erythema.  Eyes:     Extraocular Movements: Extraocular movements intact.     Conjunctiva/sclera: Conjunctivae normal.     Pupils: Pupils are equal, round, and reactive to light.  Neck:     Musculoskeletal: Normal range of motion and neck supple. No neck rigidity or muscular tenderness.  Cardiovascular:     Rate and Rhythm: Normal rate and regular rhythm.     Pulses: Normal pulses.  Pulmonary:     Effort: Pulmonary effort is normal.     Breath sounds: Normal breath sounds. No wheezing, rhonchi or rales.  Abdominal:     Palpations: Abdomen is soft.     Tenderness: There is no abdominal tenderness. There is no guarding or rebound.  Musculoskeletal:     Right lower leg: No edema.     Left lower leg: No edema.     Comments: No C, T, or L midline spinal tenderness. No paraspinal tenderness to palpation diffusely.   Lymphadenopathy:     Cervical: No cervical adenopathy.  Skin:    General: Skin is warm and dry.  Neurological:     Mental Status: She is alert.     Comments: CN 3-12 grossly intact A&O x4 GCS 15 Sensation and strength intact Coordination with finger-to-nose WNL Neg pronator drift       ED Treatments / Results  Labs (all labs ordered are listed, but only abnormal results are displayed) Labs Reviewed - No data to display  EKG None  Radiology Dg Chest 2 View  Result Date:  12/26/2018 CLINICAL DATA:  Cough, left lateral neck pain and mid lower back pain for 3-4 days EXAM: CHEST - 2 VIEW COMPARISON:  Radiograph 09/19/2016 FINDINGS: The heart size and mediastinal contours are within normal limits. Both lungs are clear. The visualized skeletal structures are unremarkable. IMPRESSION: No active cardiopulmonary disease. Electronically Signed   By: MD Kreg ShropshirePrice  Dehay   On: 12/26/2018 20:11    Procedures Procedures (including critical care time)  Medications Ordered in ED Medications - No  data to display   Initial Impression / Assessment and Plan / ED Course  I have reviewed the triage vital signs and the nursing notes.  Pertinent labs & imaging results that were available during my care of the patient were reviewed by me and considered in my medical decision making (see chart for details).    28 year old female presenting with general complaints of neck pain and low back pain as well as foul taste in mouth. Has had other complaints over the past 1.5 weeks including headache, dizziness, nausea, body aches. Pt was treated for vertigo; headache resolved. She was then tested for covid 19 which was negative. Pt states she does not feel like herself and wants to know what is going on. She did not follow up with her PCP afterwards. She has had no fevers, neck stiffness, rash. Headache has resolved; no meningeal signs, no nystagmus currently. No focal neuro deficits on exam. She reports hx of headaches and has been worked up in the past with both CT scans and MRIs; do not feel patient needs imaging of head today as headache has resolved over 4 days ago. Pt afebrile in the ED today; does not meet SIRS criteria currently. No C, T, or L midline spinal tenderness on exam. No paraspinal tenderness; no decreased ROM. No known injury. No CVA tenderness to suggest UTI vs pyelo. No red flag symptoms. Do not feel patient needs imaging of spine today. Concerning foul taste in mouth; no signs of  dental decay or dental abscess. Oropharynx clear at this time without erythema, edema, or exudate. No post nasal drip. No thrush. No sinus pressure bilaterally. Pt reports hx of sinus infections and states this is different. She is requesting chest x ray at this time; states no cough or shortness of breath but wants to know what is going on. Do not feel patient needs xray at this time but she is adamant; will oblige although do not suspect any abnormalities. Do not feel patient has any symptoms concerning for emergency today; will have her follow up outpatient with PCP.   Xray negative at this time; pt to follow up with her PCP regarding symptoms.        Final Clinical Impressions(s) / ED Diagnoses   Final diagnoses:  Neck pain    ED Discharge Orders    None       Tanda RockersVenter, Carver Murakami, PA-C 12/27/18 0022    Sabas SousBero, Michael M, MD 12/28/18 [phone]

## 2019-02-06 ENCOUNTER — Ambulatory Visit: Payer: Managed Care, Other (non HMO) | Admitting: Allergy

## 2019-02-06 ENCOUNTER — Other Ambulatory Visit: Payer: Self-pay

## 2019-02-06 ENCOUNTER — Encounter: Payer: Self-pay | Admitting: Allergy

## 2019-02-06 VITALS — BP 106/70 | HR 90 | Temp 97.6°F | Resp 16 | Ht 66.0 in | Wt 162.0 lb

## 2019-02-06 DIAGNOSIS — R51 Headache: Secondary | ICD-10-CM | POA: Diagnosis not present

## 2019-02-06 DIAGNOSIS — R21 Rash and other nonspecific skin eruption: Secondary | ICD-10-CM

## 2019-02-06 DIAGNOSIS — J3089 Other allergic rhinitis: Secondary | ICD-10-CM | POA: Diagnosis not present

## 2019-02-06 DIAGNOSIS — G8929 Other chronic pain: Secondary | ICD-10-CM

## 2019-02-06 MED ORDER — FLUTICASONE PROPIONATE 50 MCG/ACT NA SUSP: 2.0000 | Freq: Every day | NASAL | 3 refills | Status: DC

## 2019-02-06 NOTE — Patient Instructions (Addendum)
Today's skin testing showed: Mildly positive to dust mites. Negative to foods.  Get bloodwork and will make additional recommendations based on results.  Start environmental control measures.  Keep track of infections.  Start Flonase 2 sprays daily for 2 weeks to see if headaches get better otherwise use it as needed. Nasal saline spray (i.e., Simply Saline) or nasal saline lavage (i.e., NeilMed) is recommended as needed and prior to medicated nasal sprays. May use over the counter antihistamines such as Zyrtec (cetirizine), Claritin (loratadine), Allegra (fexofenadine), or Xyzal (levocetirizine) daily as needed.  Keep food diary for headaches and foods. If persistent follow up with PCP for the headaches.   Follow up in 3-4 months If yeast infections still a problem, recommend follow up with gynecology.   Control of House Dust Mite Allergen . Dust mite allergens are a common trigger of allergy and asthma symptoms. While they can be found throughout the house, these microscopic creatures thrive in warm, humid environments such as bedding, upholstered furniture and carpeting. . Because so much time is spent in the bedroom, it is essential to reduce mite levels there.  . Encase pillows, mattresses, and box springs in special allergen-proof fabric covers or airtight, zippered plastic covers.  . Bedding should be washed weekly in hot water (130 F) and dried in a hot dryer. Allergen-proof covers are available for comforters and pillows that can't be regularly washed.  Wendee Copp the allergy-proof covers every few months. Minimize clutter in the bedroom. Keep pets out of the bedroom.  Marland Kitchen Keep humidity less than 50% by using a dehumidifier or air conditioning. You can buy a humidity measuring device called a hygrometer to monitor this.  . If possible, replace carpets with hardwood, linoleum, or washable area rugs. If that's not possible, vacuum frequently with a vacuum that has a HEPA filter. . Remove  all upholstered furniture and non-washable window drapes from the bedroom. . Remove all non-washable stuffed toys from the bedroom.  Wash stuffed toys weekly.  Skin care recommendations  Bath time: . Always use lukewarm water. AVOID very hot or cold water. Marland Kitchen Keep bathing time to 5-10 minutes. . Do NOT use bubble bath. . Use a mild soap and use just enough to wash the dirty areas. . Do NOT scrub skin vigorously.  . After bathing, pat dry your skin with a towel. Do NOT rub or scrub the skin.  Moisturizers and prescriptions:  . ALWAYS apply moisturizers immediately after bathing (within 3 minutes). This helps to lock-in moisture. . Use the moisturizer several times a day over the whole body. Kermit Balo summer moisturizers include: Aveeno, CeraVe, Cetaphil. Kermit Balo winter moisturizers include: Aquaphor, Vaseline, Cerave, Cetaphil, Eucerin, Vanicream. . When using moisturizers along with medications, the moisturizer should be applied about one hour after applying the medication to prevent diluting effect of the medication or moisturize around where you applied the medications. When not using medications, the moisturizer can be continued twice daily as maintenance.  Laundry and clothing: . Avoid laundry products with added color or perfumes. . Use unscented hypo-allergenic laundry products such as Tide free, Cheer free & gentle, and All free and clear.  . If the skin still seems dry or sensitive, you can try double-rinsing the clothes. . Avoid tight or scratchy clothing such as wool. . Do not use fabric softeners or dyer sheets.

## 2019-02-06 NOTE — Progress Notes (Signed)
New Patient Note  RE: Lisa Bray MRN: [id number] DOB: 09-Dec-1990 Date of Office Visit: 02/06/2019  Referring provider: Filiberto Pinks Primary care provider: Maude Leriche, PA-C  Chief Complaint: Headache (develops headaches after eating. feels unbalanced alot. her face is breaking out more often, especially where her mask is. she also has been getting frequent vaginal yeast infections and thrush.)  History of Present Illness: I had the pleasure of seeing Salvatore Poe for initial evaluation at the [facility] on 02/07/2019. She is a 28 y.o. female, who is referred here by Scifres, Earlie Server, PA-C for the evaluation of allergy testing.  Rhinitis: She reports symptoms of sinus infections, ear pressure. Symptoms have been going on for 1 years. Anosmia: no. Headache: yes. She has used nasal spray prn with some improvement in symptoms. Sinus infections: 2-3 this year which is treated with antibiotics and has been having more yeast infections. Previous work up includes: 10 years ago which was positive to dust, cockroaches per patient report. Previous ENT evaluation: yes last year for vertigo.  Previous sinus imaging: no. Last eye exam: no. History of nasal polyps: no.  Food/headaches.  Noticed headaches after eating 15-20 minutes. Lasting about 2-3 hours. This occurs on a daily basis. No triggers noted. Worse this past year.  She eats rice, pasta, fruits and vegetables.  Dietary History: patient has been eating other foods including milk, limited eggs, peanut, treenuts, sesame, shellfish, seafood, soy, wheat, meats, fruits and vegetables. No history of migraines but has h/o syncope.  Takes OTC NSAIDS prn with some benefit. Usually takes it a few days out of the week.   Rash: Issues with acne breakouts around the chin area. This is worsening the past year. Concerned if triggered by food. She does wear a mask throughout the day at her job.  No change in  personal care products.   Assessment and Plan: Minaal is a 28 y.o. female with: Other allergic rhinitis Frequent sinus infections and headaches the last 1 year. Tried nasal spray prn with some benefit. Sinusitis treated with antibiotics which has led to frequent yeast infections as well.   Today's skin prick testing showed: Mildly positive to dust mites.  Recommended intradermal testing today but patient would prefer to get bloodwork instead.   Get bloodwork and will make additional recommendations based on results.   Start environmental control measures.  Keep track of sinus infections. If still persistent then will get basic immune evaluation bloodwork. Yeast infections most likely due to disruption of vaginal flora due to antibiotics. If persistent recommend follow up with gynecology.   Start Flonase 2 sprays daily for 2 weeks to see if headaches get better otherwise use it as needed.  Nasal saline spray (i.e., Simply Saline) or nasal saline lavage (i.e., NeilMed) is recommended as needed and prior to medicated nasal sprays.  May use over the counter antihistamines such as Zyrtec (cetirizine), Claritin (loratadine), Allegra (fexofenadine), or Xyzal (levocetirizine) daily as needed.   Chronic nonintractable headache Headaches mainly 15-20 minutes after meals lasting 2-3 hours. No specific food triggers noted. Takes OTC NSAIDs prn with some benefit. No previous diagnosis of migraines. Apparently she had normal labs by PCP recently checking her glucose.   Today's skin prick testing was negative to foods.   Discussed that her headaches may not be related to foods per se.   Keep diary for headaches and foods.  Follow up with PCP for the headaches.   Rash and other nonspecific skin eruption  Worsening rash on the face is most likely acne which is triggered by the new prolonged wearing of the face masks.   Discussed this is not triggered by foods that she is eating.  Discussed  proper skin care.    Return in about 3 months (around 05/09/2019).  Meds ordered this encounter  Medications  . fluticasone (FLONASE) 50 MCG/ACT nasal spray    Sig: Place 2 sprays into both nostrils daily.    Dispense:  16 g    Refill:  3    Lab Orders     Allergens w/Total IgE Area 2  Other allergy screening: Asthma: no Medication allergy: no Hymenoptera allergy: no Urticaria: no Eczema:no History of recurrent infections suggestive of immunodeficency: no  Diagnostics: Skin Testing: Environmental allergy panel and select foods. Positive test to: dust mites. Negative test to: foods as below.  Results discussed with patient/family. Airborne Adult Perc - 02/06/19 0948    Time Antigen Placed  0955    Allergen Manufacturer  Waynette ButteryGreer    Location  Back    Number of Test  59    1. Control-Buffer 50% Glycerol  Negative    2. Control-Histamine 1 mg/ml  2+    3. Albumin saline  Negative    4. Bahia  Negative    5. French Southern Territ[place]ermuda  Negative    6. Johnson  Negative    7. Kentucky Blue  Negative    8. Meadow Fescue  Negative    9. Perennial Rye  Negative    10. Sweet Vernal  Negative    11. Timothy  Negative    12. Cocklebur  Negative    13. Burweed Marshelder  Negative    14. Ragweed, short  Negative    15. Ragweed, Giant  Negative    16. Plantain,  English  Negative    17. Lamb's Quarters  Negative    18. Sheep Sorrell  Negative    19. Rough Pigweed  Negative    20. Marsh Elder, Rough  Negative    21. Mugwort, Common  Negative    22. Ash mix  Negative    23. Birch mix  Negative    24. Beech American  Negative    25. Box, Elder  Negative    26. Cedar, red  Negative    27. Cottonwood, Guinea-BissauEastern  Negative    28. Elm mix  Negative    29. Hickory mix  Negative    30. Maple mix  Negative    31. Oak, Guinea-Bissau[place]astern mix  Negative    32. Pecan Pollen  Negative    33. Pine mix  Negative    34. Sycamore Eastern  Negative    35. Walnut, Black Pollen  Negative    36. Alternaria alternata   Negative    37. Cladosporium Herbarum  Negative    38. Aspergillus mix  Negative    39. Penicillium mix  Negative    40. Bipolaris sorokiniana (Helminthosporium)  Negative    41. Drechslera spicifera (Curvularia)  Negative    42. Mucor plumbeus  Negative    43. Fusarium moniliforme  Negative    44. Aureobasidium pullulans (pullulara)  Negative    45. Rhizopus oryzae  Negative    46. Botrytis cinera  Negative    47. Epicoccum nigrum  Negative    48. Phoma betae  Negative    49. Candida Albicans  Negative    50. Trichophyton mentagrophytes  Negative    51. Mite, D Luanne BrasFarinae  5,000 AU/ml  Negative    52. Mite, D Pteronyssinus  5,000 AU/ml  2+    53. Cat Hair 10,000 BAU/ml  Negative    54.  Dog Epithelia  Negative    55. Mixed Feathers  Negative    56. Horse Epithelia  Negative    57. Cockroach, German  Negative    58. Mouse  Negative    59. Tobacco Leaf  Negative     Food Perc - 02/06/19 0949    Time Antigen Placed  16100955    Allergen Manufacturer  Waynette ButteryGreer    Location  Back    Number of allergen test  10    1. Peanut  Negative    2. Soybean food  Negative    3. Wheat, whole  Negative    4. Sesame  Negative    5. Milk, cow  Negative    6. Egg White, chicken  Negative    7. Casein  Negative    8. Shellfish mix  Negative    9. Fish mix  Negative    10. Cashew  Negative     Food Adult Perc - 02/06/19 0900    Time Antigen Placed  [id number]    Allergen Manufacturer  Waynette ButteryGreer    Location  Back    Number of allergen test  2    34. Rice  Negative    63. Pineapple  Negative       Past Medical History: Patient Active Problem List   Diagnosis Date Noted  . Chronic nonintractable headache 02/07/2019  . Rash and other nonspecific skin eruption 02/07/2019  . Other allergic rhinitis 02/06/2019   Past Medical History:  Diagnosis Date  . Neurogenic syncope   . Recurrent upper respiratory infection (URI)    Past Surgical History: Past Surgical History:  Procedure Laterality Date  .  BREAST BIOPSY Right    Medication List:  Current Outpatient Medications  Medication Sig Dispense Refill  . fluticasone (FLONASE) 50 MCG/ACT nasal spray Place 2 sprays into both nostrils daily. 16 g 0  . ibuprofen (ADVIL,MOTRIN) 200 MG tablet Take 200 mg by mouth every 8 (eight) hours as needed.    . fluticasone (FLONASE) 50 MCG/ACT nasal spray Place 2 sprays into both nostrils daily. 16 g 3   No current facility-administered medications for this visit.    Allergies: No Known Allergies Social History: Social History   Socioeconomic History  . Marital status: Single    Spouse name: Not on file  . Number of children: 0  . Years of education: BS  . Highest education level: Not on file  Occupational History  . Occupation: [employer]  Social Needs  . Financial resource strain: Not on file  . Food insecurity    Worry: Not on file    Inability: Not on file  . Transportation needs    Medical: Not on file    Non-medical: Not on file  Tobacco Use  . Smoking status: Never Smoker  . Smokeless tobacco: Never Used  Substance and Sexual Activity  . Alcohol use: No  . Drug use: No  . Sexual activity: Not on file  Lifestyle  . Physical activity    Days per week: Not on file    Minutes per session: Not on file  . Stress: Not on file  Relationships  . Social Musicianconnections    Talks on phone: Not on file    Gets together: Not on file    Attends religious service: Not  on file    Active member of club or organization: Not on file    Attends meetings of clubs or organizations: Not on file    Relationship status: Not on file  Other Topics Concern  . Not on file  Social History Narrative   Lives at home, single   Right-handed   No caffeine   Lives in a 27 year old apartment. Smoking: no Occupation: Primary school teacherlab tech  Environmental HistorySurveyor, minerals: Water Damage/mildew in the house: yes in the old apartment Carpet in the family room: yes Carpet in the bedroom: yes Heating: electric Cooling: central  Pet: no  Family History: Family History  Problem Relation Age of Onset  . Hypertension Father   . Glaucoma Father   . Migraines Neg Hx    Problem                               Relation Asthma                                   Brother  Eczema                                No  Food allergy                          No  Allergic rhino conjunctivitis     No   Review of Systems  Constitutional: Negative for appetite change, chills, fever and unexpected weight change.  HENT: Negative for congestion and rhinorrhea.   Eyes: Negative for itching.  Respiratory: Negative for cough, chest tightness, shortness of breath and wheezing.   Cardiovascular: Negative for chest pain.  Gastrointestinal: Negative for abdominal pain.  Genitourinary: Negative for difficulty urinating.  Skin: Positive for rash.  Allergic/Immunologic: Positive for environmental allergies. Negative for food allergies.  Neurological: Positive for headaches.   Objective: BP 106/70 (BP Location: Left Arm, Patient Position: Sitting, Cuff Size: Normal)   Pulse 90   Temp 97.6 F (36.4 C) (Temporal)   Resp 16   Ht 5\' 6"  (1.676 m)   Wt 162 lb (73.5 kg)   SpO2 99%   BMI 26.15 kg/m  Body mass index is 26.15 kg/m. Physical Exam  Constitutional: She is oriented to person, place, and time. She appears well-developed and well-nourished.  HENT:  Head: Normocephalic and atraumatic.  Right Ear: External ear normal.  Left Ear: External ear normal.  Nose: Nose normal.  Mouth/Throat: Oropharynx is clear and moist.  Eyes: Conjunctivae and EOM are normal.  Neck: Neck supple.  Cardiovascular: Normal rate, regular rhythm and normal heart sounds. Exam reveals no gallop and no friction rub.  No murmur heard. Pulmonary/Chest: Effort normal and breath sounds normal. She has no wheezes. She has no rales.  Abdominal: Soft.  Neurological: She is alert and oriented to person, place, and time.  Skin: Skin is warm. Rash noted.  Acne on  the lower half of the face.   Psychiatric: She has a normal mood and affect. Her behavior is normal.  Nursing note and vitals reviewed.  The plan was reviewed with the patient/family, and all questions/concerned were addressed.  It was my pleasure to see Karolee OhsWyoshia today and participate in her care. Please feel free to contact me with any questions or  concerns.  Sincerely,  Rexene Alberts, DO Allergy & Immunology  [facility] office: [phone] [facility] office: [place] office: [phone]

## 2019-02-07 DIAGNOSIS — G8929 Other chronic pain: Secondary | ICD-10-CM | POA: Insufficient documentation

## 2019-02-07 DIAGNOSIS — R21 Rash and other nonspecific skin eruption: Secondary | ICD-10-CM | POA: Insufficient documentation

## 2019-02-07 NOTE — Assessment & Plan Note (Signed)
Headaches mainly 15-20 minutes after meals lasting 2-3 hours. No specific food triggers noted. Takes OTC NSAIDs prn with some benefit. No previous diagnosis of migraines. Apparently she had normal labs by PCP recently checking her glucose.   Today's skin prick testing was negative to foods.   Discussed that her headaches may not be related to foods per se.   Keep diary for headaches and foods.  Follow up with PCP for the headaches.

## 2019-02-07 NOTE — Assessment & Plan Note (Signed)
Frequent sinus infections and headaches the last 1 year. Tried nasal spray prn with some benefit. Sinusitis treated with antibiotics which has led to frequent yeast infections as well.   Today's skin prick testing showed: Mildly positive to dust mites.  Recommended intradermal testing today but patient would prefer to get bloodwork instead.   Get bloodwork and will make additional recommendations based on results.   Start environmental control measures.  Keep track of sinus infections. If still persistent then will get basic immune evaluation bloodwork. Yeast infections most likely due to disruption of vaginal flora due to antibiotics. If persistent recommend follow up with gynecology.   Start Flonase 2 sprays daily for 2 weeks to see if headaches get better otherwise use it as needed.  Nasal saline spray (i.e., Simply Saline) or nasal saline lavage (i.e., NeilMed) is recommended as needed and prior to medicated nasal sprays.  May use over the counter antihistamines such as Zyrtec (cetirizine), Claritin (loratadine), Allegra (fexofenadine), or Xyzal (levocetirizine) daily as needed.

## 2019-02-07 NOTE — Assessment & Plan Note (Signed)
Worsening rash on the face is most likely acne which is triggered by the new prolonged wearing of the face masks.   Discussed this is not triggered by foods that she is eating.  Discussed proper skin care.

## 2019-02-08 LAB — ALLERGENS W/TOTAL IGE AREA 2
Alternaria Alternata IgE: <0.1 kU/L
Aspergillus Fumigatus IgE: <0.1 kU/L
Bermuda Grass IgE: <0.1 kU/L
Cat Dander IgE: <0.1 kU/L
Cedar, Mountain IgE: <0.1 kU/L
Cladosporium Herbarum IgE: <0.1 kU/L
Cockroach, German IgE: <0.1 kU/L
Common Silver Birch IgE: <0.1 kU/L
Cottonwood IgE: <0.1 kU/L
D Farinae IgE: <0.1 kU/L
D Pteronyssinus IgE: 0.2 kU/L — AB
Dog Dander IgE: <0.1 kU/L
Elm, American IgE: <0.1 kU/L
IgE (Immunoglobulin E), Serum: 25 IU/mL (ref 6–495)
Johnson Grass IgE: <0.1 kU/L
Maple/Box Elder IgE: <0.1 kU/L
Mouse Urine IgE: <0.1 kU/L
Oak, White IgE: <0.1 kU/L
Pecan, Hickory IgE: <0.1 kU/L
Penicillium Chrysogen IgE: <0.1 kU/L
Pigweed, Rough IgE: <0.1 kU/L
Ragweed, Short IgE: <0.1 kU/L
Sheep Sorrel IgE Qn: <0.1 kU/L
Timothy Grass IgE: <0.1 kU/L
White Mulberry IgE: <0.1 kU/L

## 2019-05-10 ENCOUNTER — Ambulatory Visit: Payer: Managed Care, Other (non HMO) | Admitting: Allergy

## 2019-05-15 ENCOUNTER — Other Ambulatory Visit: Payer: Self-pay

## 2019-05-15 DIAGNOSIS — Z20822 Contact with and (suspected) exposure to covid-19: Secondary | ICD-10-CM

## 2019-05-17 LAB — NOVEL CORONAVIRUS, NAA: SARS-CoV-2, NAA: NOT DETECTED

## 2019-06-09 ENCOUNTER — Other Ambulatory Visit (HOSPITAL_COMMUNITY): Payer: Self-pay | Admitting: Neurology

## 2019-06-09 ENCOUNTER — Other Ambulatory Visit: Payer: Self-pay | Admitting: Neurology

## 2019-06-09 DIAGNOSIS — R9082 White matter disease, unspecified: Secondary | ICD-10-CM

## 2019-06-21 ENCOUNTER — Other Ambulatory Visit: Payer: Self-pay

## 2019-06-21 ENCOUNTER — Ambulatory Visit
Admission: RE | Admit: 2019-06-21 | Discharge: 2019-06-21 | Disposition: A | Discharge status: Home / Self Care | Payer: Managed Care, Other (non HMO) | Source: Ambulatory Visit | Attending: Neurology | Admitting: Neurology

## 2019-06-21 DIAGNOSIS — R9082 White matter disease, unspecified: Secondary | ICD-10-CM | POA: Diagnosis present

## 2019-06-21 MED ORDER — GADOBUTROL 1 MMOL/ML IV SOLN
7.0000 mL | Freq: Once | INTRAVENOUS | Status: AC | PRN
  Administered 2019-06-21: 09:00:00 7 mL via INTRAVENOUS

## 2019-07-28 ENCOUNTER — Other Ambulatory Visit: Payer: Self-pay

## 2019-07-28 ENCOUNTER — Emergency Department (HOSPITAL_COMMUNITY)
Admission: EM | Admit: 2019-07-28 | Discharge: 2019-07-28 | Disposition: A | Discharge status: Home / Self Care | Payer: Managed Care, Other (non HMO) | Attending: Emergency Medicine | Admitting: Emergency Medicine

## 2019-07-28 ENCOUNTER — Encounter (HOSPITAL_COMMUNITY): Payer: Self-pay

## 2019-07-28 DIAGNOSIS — Z0489 Encounter for examination and observation for other specified reasons: Secondary | ICD-10-CM | POA: Diagnosis present

## 2019-07-28 DIAGNOSIS — Z03818 Encounter for observation for suspected exposure to other biological agents ruled out: Secondary | ICD-10-CM | POA: Diagnosis not present

## 2019-07-28 DIAGNOSIS — Z79899 Other long term (current) drug therapy: Secondary | ICD-10-CM | POA: Insufficient documentation

## 2019-07-28 LAB — CBC WITH DIFFERENTIAL/PLATELET
Abs Immature Granulocytes: 0.01 10*3/uL (ref 0.00–0.07)
Basophils Absolute: 0 10*3/uL (ref 0.0–0.1)
Basophils Relative: 1 %
Eosinophils Absolute: 0.1 10*3/uL (ref 0.0–0.5)
Eosinophils Relative: 2 %
HCT: 35.8 % — ABNORMAL LOW (ref 36.0–46.0)
Hemoglobin: 12.4 g/dL (ref 12.0–15.0)
Immature Granulocytes: 0 %
Lymphocytes Relative: 29 %
Lymphs Abs: 1.9 10*3/uL (ref 0.7–4.0)
MCH: 27.1 pg (ref 26.0–34.0)
MCHC: 34.6 g/dL (ref 30.0–36.0)
MCV: 78.2 fL — ABNORMAL LOW (ref 80.0–100.0)
Monocytes Absolute: 0.6 10*3/uL (ref 0.1–1.0)
Monocytes Relative: 8 %
Neutro Abs: 4 10*3/uL (ref 1.7–7.7)
Neutrophils Relative %: 60 %
Platelets: 197 10*3/uL (ref 150–400)
RBC: 4.58 MIL/uL (ref 3.87–5.11)
RDW: 15.5 % (ref 11.5–15.5)
WBC: 6.7 10*3/uL (ref 4.0–10.5)
nRBC: 0 % (ref 0.0–0.2)

## 2019-07-28 LAB — BASIC METABOLIC PANEL
Anion gap: 7 (ref 5–15)
BUN: 10 mg/dL (ref 6–20)
CO2: 28 mmol/L (ref 22–32)
Calcium: 9.2 mg/dL (ref 8.9–10.3)
Chloride: 105 mmol/L (ref 98–111)
Creatinine, Ser: 0.61 mg/dL (ref 0.44–1.00)
GFR calc Af Amer: >60 mL/min (ref >60)
GFR calc non Af Amer: >60 mL/min (ref >60)
Glucose, Bld: 84 mg/dL (ref 70–99)
Potassium: 3.5 mmol/L (ref 3.5–5.1)
Sodium: 140 mmol/L (ref 135–145)

## 2019-07-28 NOTE — ED Provider Notes (Signed)
[facility]-EMERGENCY DEPT Provider Note   CSN: [id number] Arrival date & time: 07/28/19  1449     History Chief Complaint  Patient presents with  . possible infection    Lisa Bray is a 29 y.o. female.  HPI Patient reports she is concerned that she has developed an overgrowth of yeast and other bacteria because she had been on quite a few antibiotics for her sinuses for a period of time.  Her current symptoms started in the summer about August.  She reports she was getting quite a few yeast infections for period of time they did get treated with topical over-the-counter antifungals.  She is not having symptoms of yeast infection right now.  She reports however she also got symptoms of thrush several times.  She was treated for it with oral medications.  She still is periodically getting some white plaque in the mouth.  Although there is none currently.  No sore throat currently.  She is concerned for residual or persistent fungal infection.  She would like to know if there is some specific testing that could be done.  Patient reports she has been seen by ENT and GI.  GI reported that her symptom of some white, foamy material that she gets in the back of her throat sounded more like silent reflux.  She was started on a reflux medication but does not feel like it changed symptoms.    Past Medical History:  Diagnosis Date  . Neurogenic syncope   . Recurrent upper respiratory infection (URI)     Patient Active Problem List   Diagnosis Date Noted  . Chronic nonintractable headache 02/07/2019  . Rash and other nonspecific skin eruption 02/07/2019  . Other allergic rhinitis 02/06/2019    Past Surgical History:  Procedure Laterality Date  . BREAST BIOPSY Right      OB History   No obstetric history on file.     Family History  Problem Relation Age of Onset  . Hypertension Father   . Glaucoma Father   . Migraines Neg Hx     Social History   Tobacco  Use  . Smoking status: Never Smoker  . Smokeless tobacco: Never Used  Substance Use Topics  . Alcohol use: No  . Drug use: No    Home Medications Prior to Admission medications   Medication Sig Start Date End Date Taking? Authorizing Provider  fluticasone (FLONASE) 50 MCG/ACT nasal spray Place 2 sprays into both nostrils daily. Patient taking differently: Place 2 sprays into both nostrils daily as needed for allergies.  02/06/19  Yes Ellamae Sia, DO  fluticasone (FLONASE) 50 MCG/ACT nasal spray Place 2 sprays into both nostrils daily. Patient not taking: Reported on 07/28/2019 09/19/16   Loren Racer, MD    Allergies    Patient has no known allergies.  Review of Systems   Review of Systems  Physical Exam Updated Vital Signs BP 129/78   Pulse 79   Temp 98 F (36.7 C) (Oral)   Resp 14   Ht 5\' 6"  (1.676 m)   Wt 72.6 kg   LMP 06/20/2019   SpO2 100%   BMI 25.82 kg/m   Physical Exam  ED Results / Procedures / Treatments   Labs (all labs ordered are listed, but only abnormal results are displayed) Labs Reviewed  CBC WITH DIFFERENTIAL/PLATELET - Abnormal; Notable for the following components:      Result Value   HCT 35.8 (*)    MCV 78.2 (*)  All other components within normal limits  BASIC METABOLIC PANEL    EKG None  Radiology No results found.  Procedures Procedures (including critical care time)  Medications Ordered in ED Medications - No data to display  ED Course  I have reviewed the triage vital signs and the nursing notes.  Pertinent labs & imaging results that were available during my care of the patient were reviewed by me and considered in my medical decision making (see chart for details).    MDM Rules/Calculators/A&P                      Patient clinically well.  No objective findings of thrush at this time.  Basic labs rule out diabetes or concerning anomaly in CBC with differential.  By physical exam and review of systems does not appear  to have significant risk for underlying autoimmune disorder.  Vital signs normal.  Physical exam normal.  At this time recommend patient proceed with recommendations per GI for continuing antireflux medication.  We reviewed some of the more atypical symptoms that are associated with reflux.  Also recommend that at time when symptoms are present with any oral lesions, try to see her dermatologist.  Patient is seen on a regular basis by dermatology for management of adult onset acne. Final Clinical Impression(s) / ED Diagnoses Final diagnoses:  Encounter for patient concern about exposure to infectious organism    Rx / DC Orders ED Discharge Orders    None       Charlesetta Shanks, MD 07/31/19 1513

## 2019-07-28 NOTE — Discharge Instructions (Addendum)
1.  Your lab work does not show abnormalities concerning for diabetes or significant infection. 2.  You describe episodes of oral thrush although today, the condition of the mouth and teeth is very good.  Try to have your dermatologist examined your tongue and mouth when you are actually having symptoms. 3.  I do recommend you continue medication for reflux as prescribed.  If you are continuing to have significant symptoms of white foamy material coming up in your throat and mouth, you should follow-up with the gastroenterologist again and discuss other ways of identifying and diagnosing reflux.

## 2019-07-28 NOTE — ED Triage Notes (Signed)
Patient reports last year she was on antibiotics sinus infections. Patient states due to antibiotics she developed a lot of yeast infections and had to take more medications. Patient states since August 2020 she has been "spitting up" white foamy substance. Patient states this happens constantly and she thinks her microbiome is off due to the excess usage of antibiotics last year. Patient states her pcp sent her to GI and they told her it was silent reflux, pcp also sent her to ENT and did not have any findings there.

## 2020-01-30 ENCOUNTER — Other Ambulatory Visit: Payer: Self-pay | Admitting: Neurology

## 2020-01-30 DIAGNOSIS — R930 Abnormal findings on diagnostic imaging of skull and head, not elsewhere classified: Secondary | ICD-10-CM

## 2020-02-05 ENCOUNTER — Other Ambulatory Visit: Payer: Self-pay | Admitting: Neurology

## 2020-02-05 DIAGNOSIS — R9082 White matter disease, unspecified: Secondary | ICD-10-CM

## 2020-02-05 DIAGNOSIS — R930 Abnormal findings on diagnostic imaging of skull and head, not elsewhere classified: Secondary | ICD-10-CM

## 2020-02-06 ENCOUNTER — Other Ambulatory Visit: Payer: Self-pay | Admitting: Neurology

## 2020-02-06 DIAGNOSIS — R930 Abnormal findings on diagnostic imaging of skull and head, not elsewhere classified: Secondary | ICD-10-CM

## 2020-02-15 ENCOUNTER — Ambulatory Visit: Payer: Managed Care, Other (non HMO)

## 2020-03-25 ENCOUNTER — Other Ambulatory Visit: Payer: Managed Care, Other (non HMO)

## 2020-04-08 ENCOUNTER — Ambulatory Visit
Admission: RE | Admit: 2020-04-08 | Discharge: 2020-04-08 | Disposition: A | Discharge status: Home / Self Care | Payer: Managed Care, Other (non HMO) | Source: Ambulatory Visit | Attending: Neurology | Admitting: Neurology

## 2020-04-08 ENCOUNTER — Ambulatory Visit: Payer: Managed Care, Other (non HMO)

## 2021-01-30 ENCOUNTER — Other Ambulatory Visit: Payer: Self-pay

## 2021-01-30 ENCOUNTER — Emergency Department: Payer: Managed Care, Other (non HMO)

## 2021-01-30 ENCOUNTER — Emergency Department
Admission: EM | Admit: 2021-01-30 | Discharge: 2021-01-30 | Disposition: A | Discharge status: Home / Self Care | Payer: Managed Care, Other (non HMO) | Attending: Emergency Medicine | Admitting: Emergency Medicine

## 2021-01-30 DIAGNOSIS — U071 COVID-19: Secondary | ICD-10-CM | POA: Insufficient documentation

## 2021-01-30 NOTE — Discharge Instructions (Addendum)

## 2021-01-30 NOTE — ED Provider Notes (Signed)
[facility] Emergency Department Provider Note  ____________________________________________   Event Date/Time   First MD Initiated Contact with Patient 01/30/21 1424     (approximate)  I have reviewed the triage vital signs and the nursing notes.   HISTORY  Chief Complaint COVID+    HPI Lisa Bray is a 30 y.o. female presents emergency department complaining of a positive COVID test on Tuesday.  Symptoms started Saturday before.  Complaining of upper back pain, cough and congestion.  States she had upset stomach because she started on paxlovid.  She denies shortness of breath  Past Medical History:  Diagnosis Date   Neurogenic syncope    Recurrent upper respiratory infection (URI)     Patient Active Problem List   Diagnosis Date Noted   Chronic nonintractable headache 02/07/2019   Rash and other nonspecific skin eruption 02/07/2019   Other allergic rhinitis 02/06/2019    Past Surgical History:  Procedure Laterality Date   BREAST BIOPSY Right     Prior to Admission medications   Medication Sig Start Date End Date Taking? Authorizing Provider  fluticasone (FLONASE) 50 MCG/ACT nasal spray Place 2 sprays into both nostrils daily. Patient not taking: Reported on 07/28/2019 09/19/16   Loren Racer, MD  fluticasone [facility]) 50 MCG/ACT nasal spray Place 2 sprays into both nostrils daily. Patient taking differently: Place 2 sprays into both nostrils daily as needed for allergies.  02/06/19   Ellamae Sia, DO    Allergies Patient has no known allergies.  Family History  Problem Relation Age of Onset   Hypertension Father    Glaucoma Father    Migraines Neg Hx     Social History Social History   Tobacco Use   Smoking status: Never   Smokeless tobacco: Never  Vaping Use   Vaping Use: Never used  Substance Use Topics   Alcohol use: No   Drug use: No    Review of Systems  Constitutional: No fever/chills Eyes: No visual  changes. ENT: No sore throat. Respiratory: Positive cough Cardiovascular: Denies chest pain Gastrointestinal: Denies abdominal pain Genitourinary: Negative for dysuria. Musculoskeletal: Negative for back pain. Skin: Negative for rash. Psychiatric: no mood changes,     ____________________________________________   PHYSICAL EXAM:  VITAL SIGNS: ED Triage Vitals  Enc Vitals Group     BP 01/30/21 1219 118/81     Pulse Rate 01/30/21 1219 83     Resp 01/30/21 1219 18     Temp 01/30/21 1219 99.4 F (37.4 C)     Temp Source 01/30/21 1219 Oral     SpO2 01/30/21 1219 100 %     Weight --      Height --      Head Circumference --      Peak Flow --      Pain Score 01/30/21 1214 5     Pain Loc --      Pain Edu? --      Excl. in GC? --     Constitutional: Alert and oriented. Well appearing and in no acute distress. Eyes: Conjunctivae are normal.  Head: Atraumatic. Nose: No congestion/rhinnorhea. Mouth/Throat: Mucous membranes are moist.   Neck:  supple no lymphadenopathy noted Cardiovascular: Normal rate, regular rhythm. Heart sounds are normal Respiratory: Normal respiratory effort.  No retractions, lungs c t a  GU: deferred Musculoskeletal: FROM all extremities, warm and well perfused Neurologic:  Normal speech and language.  Skin:  Skin is warm, dry and intact. No rash noted. Psychiatric:  Mood and affect are normal. Speech and behavior are normal.  ____________________________________________   LABS (all labs ordered are listed, but only abnormal results are displayed)  Labs Reviewed - No data to display ____________________________________________   ____________________________________________  RADIOLOGY  Chest x-ray  ____________________________________________   PROCEDURES  Procedure(s) performed: No  Procedures    ____________________________________________   INITIAL IMPRESSION / ASSESSMENT AND PLAN / ED COURSE  Pertinent labs & imaging  results that were available during my care of the patient were reviewed by me and considered in my medical decision making (see chart for details).   Patient is a 30 year old female presents with a positive COVID test on Tuesday.  States her symptoms started on Saturday.  See HPI.  Physical exam shows patient per stable  Chest x-ray reviewed by me confirmed by radiology to be negative for any acute abnormality  I did explain the findings to the patient.  Since she is not vaccinated she will need to quarantine for 10 days.  She was given several over-the-counter treatments options.  He can also discontinue the antiviral since it is upsetting her stomach.  Explained to her this strain of virus is very mild compared to the previous strain.  If she is worsening she can return emergency department.  Follow-up with her regular doctor as needed.  She was discharged stable condition and is in agreement with treatment plan.     Lisa Bray was evaluated in Emergency Department on 01/30/2021 for the symptoms described in the history of present illness. She was evaluated in the context of the global COVID-19 pandemic, which necessitated consideration that the patient might be at risk for infection with the SARS-CoV-2 virus that causes COVID-19. Institutional protocols and algorithms that pertain to the evaluation of patients at risk for COVID-19 are in a state of rapid change based on information released by regulatory bodies including the CDC and federal and state organizations. These policies and algorithms were followed during the patient's care in the ED.    As part of my medical decision making, I reviewed the following data within the electronic MEDICAL RECORD NUMBER Nursing notes reviewed and incorporated, Old chart reviewed, Radiograph reviewed , Notes from prior ED visits, and [place] Controlled Substance Database  ____________________________________________   FINAL CLINICAL IMPRESSION(S) / ED  DIAGNOSES  Final diagnoses:  COVID-19      NEW MEDICATIONS STARTED DURING THIS VISIT:  New Prescriptions   No medications on file     Note:  This document was prepared using Dragon voice recognition software and may include unintentional dictation errors.    Faythe Ghee, PA-C 01/30/21 1538    Shaune Pollack, MD 02/04/21 [phone]

## 2021-01-30 NOTE — ED Triage Notes (Signed)
Pt come with c/o COVID+ on Tuesday. Pt states she has upper back pain, cough and congestion. Pt states she wants to get it all checked out.

## 2021-09-23 ENCOUNTER — Emergency Department: Payer: BC Managed Care – PPO

## 2021-09-23 ENCOUNTER — Emergency Department
Admission: EM | Admit: 2021-09-23 | Discharge: 2021-09-23 | Disposition: A | Discharge status: Home / Self Care | Payer: BC Managed Care – PPO | Attending: Emergency Medicine | Admitting: Emergency Medicine

## 2021-09-23 ENCOUNTER — Other Ambulatory Visit: Payer: Self-pay

## 2021-09-23 DIAGNOSIS — M542 Cervicalgia: Secondary | ICD-10-CM | POA: Insufficient documentation

## 2021-09-23 DIAGNOSIS — M7918 Myalgia, other site: Secondary | ICD-10-CM

## 2021-09-23 DIAGNOSIS — W010XXA Fall on same level from slipping, tripping and stumbling without subsequent striking against object, initial encounter: Secondary | ICD-10-CM | POA: Diagnosis not present

## 2021-09-23 DIAGNOSIS — S5002XA Contusion of left elbow, initial encounter: Secondary | ICD-10-CM | POA: Insufficient documentation

## 2021-09-23 DIAGNOSIS — Y9389 Activity, other specified: Secondary | ICD-10-CM | POA: Insufficient documentation

## 2021-09-23 DIAGNOSIS — R0781 Pleurodynia: Secondary | ICD-10-CM | POA: Diagnosis not present

## 2021-09-23 DIAGNOSIS — S59902A Unspecified injury of left elbow, initial encounter: Secondary | ICD-10-CM | POA: Diagnosis present

## 2021-09-23 DIAGNOSIS — W19XXXA Unspecified fall, initial encounter: Secondary | ICD-10-CM

## 2021-09-23 MED ORDER — NAPROXEN 500 MG PO TABS: 500.0000 mg | ORAL_TABLET | Freq: Two times a day (BID) | ORAL | 0 refills | Status: AC

## 2021-09-23 MED ORDER — CYCLOBENZAPRINE HCL 5 MG PO TABS: 5.0000 mg | ORAL_TABLET | Freq: Three times a day (TID) | ORAL | 0 refills | Status: DC | PRN

## 2021-09-23 NOTE — ED Triage Notes (Signed)
Patient to ER via POV with complaints of left sided neck/ collarbone / ribs/ elbow pain after a mechanical, ground level fall on Saturday. Reports slipping on a salon floor. No LOC. Denies hitting head. No blood thinner usage.  ?

## 2021-09-23 NOTE — ED Notes (Signed)
Pen pad not working in room. Patient verbalized understand of discharge. No questions for RN. ?

## 2021-09-23 NOTE — ED Provider Notes (Signed)
? ? ?[facility] ?Emergency Department Provider Note ? ? ? ? Event Date/Time  ? First MD Initiated Contact with Patient 09/23/21 1209   ?  (approximate) ? ? ?History  ? ?Fall ? ? ?HPI ? ?Lisa Bray is a 31 y.o. female presents to the ED for evaluation of delayed onset of left-sided neck and upper trapezius muscle strain after slip and fall at a hair salon on Sunday.  Patient notes that she slipped and fell onto her left side landing on her elbow and arm.  She denies any head injury or LOC.  She presents with delayed onset of some elbow pain, as well as some left-sided rib pain, and some discomfort from the left neck into the upper trapezius and clavicle region.  She denies any distal paresthesias, shortness of breath, cough, congestion, or weakness. ?  ? ? ?Physical Exam  ? ?Triage Vital Signs: ?ED Triage Vitals  ?Enc Vitals Group  ?   BP 09/23/21 1137 (!) 142/82  ?   Pulse Rate 09/23/21 1137 84  ?   Resp 09/23/21 1137 16  ?   Temp 09/23/21 1137 98.7 ?F (37.1 ?C)  ?   Temp Source 09/23/21 1137 Oral  ?   SpO2 09/23/21 1137 100 %  ?   Weight 09/23/21 1205 160 lb 0.9 oz (72.6 kg)  ?   Height 09/23/21 1135 5\' 6"  (1.676 m)  ?   Head Circumference --   ?   Peak Flow --   ?   Pain Score 09/23/21 1135 5  ?   Pain Loc --   ?   Pain Edu? --   ?   Excl. in GC? --   ? ? ?Most recent vital signs: ?Vitals:  ? 09/23/21 1137  ?BP: (!) 142/82  ?Pulse: 84  ?Resp: 16  ?Temp: 98.7 ?F (37.1 ?C)  ?SpO2: 100%  ? ? ?General Awake, no distress.  ?HEENT NCAT. PERRL. EOMI. No rhinorrhea. Mucous membranes are moist. ?CV:  Good peripheral perfusion.  ?RESP:  Normal effort.  ?ABD:  No distention.  ?MSK:  Normal spinal alignment without midline tenderness, spasm, vomiting, or step-off.  Patient mother tender to palpation to the left paraspinal musculature and SCM musculature.  Normal range of motion of the neck without difficulty.  Mildly tender to palpation to left trapezius musculature.  Shoulder without deformity or  dislocation.  No elbow effusion, dislocation, or disability.  Normal composite fist distally.  Normal forearm pronation and supination range noted. ? ? ?ED Results / Procedures / Treatments  ? ?Labs ?(all labs ordered are listed, but only abnormal results are displayed) ?Labs Reviewed - No data to display ? ? ?EKG ? ? ? ?RADIOLOGY ? ?I personally viewed and evaluated these images as part of my medical decision making, as well as reviewing the written report by the radiologist. ? ?ED Provider Interpretation: no acute bony findings on multiple films} ? ?DG Ribs Unilateral W/Chest Left ? ?Result Date: 09/23/2021 ?CLINICAL DATA:  Pain after fall EXAM: LEFT RIBS AND CHEST - 3+ VIEW COMPARISON:  Chest radiograph 01/30/2021. FINDINGS: No displaced fracture or other bone lesions are seen involving the ribs. There is no evidence of pneumothorax or pleural effusion. Both lungs are clear. Heart size and mediastinal contours are within normal limits. IMPRESSION: No displaced rib fracture identified. Electronically Signed   By: Maudry MayhewJeffrey  Waltz M.D.   On: 09/23/2021 14:42  ? ?DG Cervical Spine 2-3 Views ? ?Result Date: 09/23/2021 ?CLINICAL DATA:  Pain  after fall. EXAM: CERVICAL SPINE - 2-3 VIEW COMPARISON:  None. FINDINGS: Seven cervical type vertebral bodies are visualized on the lateral view. There is no evidence of cervical spine fracture or prevertebral soft tissue swelling. Alignment is normal. No other significant bone abnormalities are identified. IMPRESSION: Negative cervical spine radiographs. Electronically Signed   By: Maudry Mayhew M.D.   On: 09/23/2021 14:39  ? ?DG Elbow Complete Left ? ?Result Date: 09/23/2021 ?CLINICAL DATA:  Pain after fall EXAM: LEFT ELBOW - COMPLETE 3+ VIEW COMPARISON:  None. FINDINGS: There is no evidence of fracture, dislocation, or joint effusion. There is no evidence of arthropathy or other focal bone abnormality. Soft tissues are unremarkable. IMPRESSION: No acute osseous abnormality.  Electronically Signed   By: Maudry Mayhew M.D.   On: 09/23/2021 14:41  ? ?DG Shoulder Left ? ?Result Date: 09/23/2021 ?CLINICAL DATA:  Shoulder pain after fall EXAM: LEFT SHOULDER - 2+ VIEW COMPARISON:  None. FINDINGS: There is no evidence of fracture or dislocation. There is no evidence of arthropathy or other focal bone abnormality. Soft tissues are unremarkable. Visualized lung fields are clear. IMPRESSION: No acute osseous abnormality. Electronically Signed   By: Maudry Mayhew M.D.   On: 09/23/2021 14:40   ? ? ?PROCEDURES: ? ?Critical Care performed: No ? ?Procedures ? ? ?MEDICATIONS ORDERED IN ED: ?Medications - No data to display ? ? ?IMPRESSION / MDM / ASSESSMENT AND PLAN / ED COURSE  ?I reviewed the triage vital signs and the nursing notes. ?             ?               ? ?Differential diagnosis includes, but is not limited to, cervical strain, elbow contusion, elbow fracture, elbow dislocation, shoulder dislocation, shoulder fracture, rib fracture, chest contusion, myalgias ? ?Patient to the ED for evaluation of injury sustained following mechanical fall.  Patient had a slip and fall onto her left side 3 days prior to arrival.  She presents with delayed onset of muscle soreness to the left neck, upper back, and elbow.  Patient is evaluated for complaints in the ED, found to have a reassuring work-up without evidence of acute bony deformity or arthropathy.  Further evaluation with plain film x-rays of the cervical spine, left shoulder, left ribs/chest, and left elbow all negative and reassuring for any acute bony findings.  Patient's clinical picture is reassuring overall.  Patient's diagnosis is consistent with elbow contusion and musculoskeletal pain secondary to mechanical fall. Patient will be discharged home with prescriptions for cyclobenzaprine and ibuprofen. Patient is to follow up with her primary provider or local urgent care as needed or otherwise directed. Patient is given ED precautions to  return to the ED for any worsening or new symptoms. ? ?FINAL CLINICAL IMPRESSION(S) / ED DIAGNOSES  ? ?Final diagnoses:  ?Fall, initial encounter  ?Contusion of left elbow, initial encounter  ?Musculoskeletal pain  ? ? ? ?Rx / DC Orders  ? ?ED Discharge Orders   ? ?      Ordered  ?  naproxen (NAPROSYN) 500 MG tablet  2 times daily with meals       ? 09/23/21 1402  ?  cyclobenzaprine (FLEXERIL) 5 MG tablet  3 times daily PRN       ? 09/23/21 1402  ? ?  ?  ? ?  ? ? ? ?Note:  This document was prepared using Dragon voice recognition software and may include unintentional dictation errors. ? ?  ?  Lissa Hoard, PA-C ?09/23/21 1922 ? ?  ?Chesley Noon, MD ?09/24/21 2354 ? ?

## 2021-09-23 NOTE — Discharge Instructions (Addendum)
Your exam and x-rays are normal at this time.  Your symptoms are consistent with a fall and muscle strain.  Take the prescription medicines as needed.  Follow-up with your primary provider for ongoing symptoms. ?

## 2022-01-29 ENCOUNTER — Other Ambulatory Visit: Payer: Self-pay | Admitting: Family Medicine

## 2022-01-29 DIAGNOSIS — R350 Frequency of micturition: Secondary | ICD-10-CM

## 2022-01-30 ENCOUNTER — Other Ambulatory Visit: Payer: BC Managed Care – PPO

## 2022-01-30 ENCOUNTER — Other Ambulatory Visit: Payer: Self-pay | Admitting: Family Medicine

## 2022-01-30 ENCOUNTER — Ambulatory Visit
Admission: RE | Admit: 2022-01-30 | Discharge: 2022-01-30 | Disposition: A | Discharge status: Home / Self Care | Payer: BC Managed Care – PPO | Source: Ambulatory Visit | Attending: Family Medicine | Admitting: Family Medicine

## 2022-01-30 DIAGNOSIS — R350 Frequency of micturition: Secondary | ICD-10-CM

## 2022-02-20 DIAGNOSIS — I2699 Other pulmonary embolism without acute cor pulmonale: Secondary | ICD-10-CM

## 2022-02-20 HISTORY — DX: Other pulmonary embolism without acute cor pulmonale: I26.99

## 2022-03-01 ENCOUNTER — Other Ambulatory Visit: Payer: Self-pay

## 2022-03-01 ENCOUNTER — Emergency Department: Payer: BC Managed Care – PPO

## 2022-03-01 ENCOUNTER — Emergency Department
Admission: EM | Admit: 2022-03-01 | Discharge: 2022-03-02 | Disposition: A | Discharge status: Home / Self Care | Payer: BC Managed Care – PPO | Attending: Emergency Medicine | Admitting: Emergency Medicine

## 2022-03-01 DIAGNOSIS — R079 Chest pain, unspecified: Secondary | ICD-10-CM | POA: Diagnosis present

## 2022-03-01 DIAGNOSIS — D72829 Elevated white blood cell count, unspecified: Secondary | ICD-10-CM | POA: Insufficient documentation

## 2022-03-01 DIAGNOSIS — Z20822 Contact with and (suspected) exposure to covid-19: Secondary | ICD-10-CM | POA: Insufficient documentation

## 2022-03-01 DIAGNOSIS — I2699 Other pulmonary embolism without acute cor pulmonale: Secondary | ICD-10-CM | POA: Insufficient documentation

## 2022-03-01 DIAGNOSIS — J189 Pneumonia, unspecified organism: Secondary | ICD-10-CM | POA: Diagnosis not present

## 2022-03-01 DIAGNOSIS — Z7901 Long term (current) use of anticoagulants: Secondary | ICD-10-CM | POA: Diagnosis not present

## 2022-03-01 LAB — CBC
HCT: 32.4 % — ABNORMAL LOW (ref 36.0–46.0)
Hemoglobin: 11.2 g/dL — ABNORMAL LOW (ref 12.0–15.0)
MCH: 25.5 pg — ABNORMAL LOW (ref 26.0–34.0)
MCHC: 34.6 g/dL (ref 30.0–36.0)
MCV: 73.8 fL — ABNORMAL LOW (ref 80.0–100.0)
Platelets: 215 10*3/uL (ref 150–400)
RBC: 4.39 MIL/uL (ref 3.87–5.11)
RDW: 16.3 % — ABNORMAL HIGH (ref 11.5–15.5)
WBC: 10.6 10*3/uL — ABNORMAL HIGH (ref 4.0–10.5)
nRBC: 0 % (ref 0.0–0.2)

## 2022-03-01 LAB — BASIC METABOLIC PANEL
Anion gap: 8 (ref 5–15)
BUN: 10 mg/dL (ref 6–20)
CO2: 27 mmol/L (ref 22–32)
Calcium: 8.9 mg/dL (ref 8.9–10.3)
Chloride: 104 mmol/L (ref 98–111)
Creatinine, Ser: 0.7 mg/dL (ref 0.44–1.00)
GFR, Estimated: >60 mL/min (ref >60)
Glucose, Bld: 102 mg/dL — ABNORMAL HIGH (ref 70–99)
Potassium: 3.6 mmol/L (ref 3.5–5.1)
Sodium: 139 mmol/L (ref 135–145)

## 2022-03-01 LAB — TROPONIN I (HIGH SENSITIVITY)
Troponin I (High Sensitivity): <2 ng/L (ref <18)
Troponin I (High Sensitivity): <2 ng/L (ref <18)

## 2022-03-01 LAB — RESP PANEL BY RT-PCR (FLU A&B, COVID) ARPGX2
Influenza A by PCR: NEGATIVE
Influenza B by PCR: NEGATIVE
SARS Coronavirus 2 by RT PCR: NEGATIVE

## 2022-03-01 LAB — POC URINE PREG, ED: Preg Test, Ur: NEGATIVE

## 2022-03-01 MED ORDER — AZITHROMYCIN 250 MG PO TABS: ORAL_TABLET | ORAL | 0 refills | Status: AC

## 2022-03-01 MED ORDER — IOHEXOL 350 MG/ML SOLN
75.0000 mL | Freq: Once | INTRAVENOUS | Status: AC | PRN
  Administered 2022-03-01: 75 mL via INTRAVENOUS

## 2022-03-01 MED ORDER — APIXABAN 5 MG PO TABS
10.0000 mg | ORAL_TABLET | Freq: Once | ORAL | Status: AC
Start: 2022-03-01 — End: 2022-03-01
  Administered 2022-03-01: 10 mg via ORAL
  Filled 2022-03-01: qty 2

## 2022-03-01 MED ORDER — AZITHROMYCIN 500 MG PO TABS
500.0000 mg | ORAL_TABLET | Freq: Once | ORAL | Status: AC
  Administered 2022-03-01: 500 mg via ORAL
  Filled 2022-03-01: qty 1

## 2022-03-01 MED ORDER — APIXABAN (ELIQUIS) VTE STARTER PACK (10MG AND 5MG): ORAL_TABLET | ORAL | 0 refills | Status: DC

## 2022-03-01 NOTE — ED Triage Notes (Signed)
Pt states pain in left side from collarbone down to upper abd. Pt states she has had nausea. Pt states hurts to take a deep breath.

## 2022-03-01 NOTE — Discharge Instructions (Signed)
Take the Eliquis as prescribed, 10 mg twice a day for a week and then 5 mg twice a day thereafter.  Take the antibiotic as prescribed and finish the full course.  Your CT scan showed a small pneumonia and a small blood clot in your lung but your ultrasound showed no blood clots in your legs.  Follow-up with your regular doctor soon as possible.  Return to the ER immediately for new, worsening, or persistent severe chest or abdominal pain, difficulty breathing, weakness or lightheadedness, high heart rate, or any other new or worsening symptoms that concern you.

## 2022-03-01 NOTE — ED Provider Notes (Signed)
[facility] Provider Note    Event Date/Time   First MD Initiated Contact with Patient 03/01/22 2121     (approximate)   History   No chief complaint on file.   HPI  Lisa Bray is a 31 y.o. female with no active medical problems who presents with left-sided chest pain over the last day which radiates from her collarbone on the left side down her chest to the left upper quadrant of her abdomen.  It is worse when she takes a deep breath.  She denies any associated shortness of breath, cough, or fever, but has had some nausea.  She has no weakness or lightheadedness.  She has no sick contacts.  She denies any prior history of this pain.  She has had no injury or trauma.     Physical Exam   Triage Vital Signs: ED Triage Vitals [03/01/22 1953]  Enc Vitals Group     BP 135/89     Pulse Rate (!) 103     Resp 16     Temp 99.7 F (37.6 C)     Temp Source Oral     SpO2 100 %     Weight 193 lb (87.5 kg)     Height 5\' 6"  (1.676 m)     Head Circumference      Peak Flow      Pain Score 8     Pain Loc      Pain Edu?      Excl. in GC?     Most recent vital signs: Vitals:   03/01/22 2230 03/01/22 2300  BP: 118/82 127/89  Pulse: 91 98  Resp: (!) 26 (!) 28  Temp:    SpO2: 99% 100%    General: Awake, no distress.  CV:  Good peripheral perfusion.  Resp:  Normal effort.  Lungs CTAB. Abd:  Soft with mild left upper quadrant discomfort but no focal tenderness.  No distention.  Other:  No peripheral edema.  No calf or popliteal swelling or tenderness.   ED Results / Procedures / Treatments   Labs (all labs ordered are listed, but only abnormal results are displayed) Labs Reviewed  BASIC METABOLIC PANEL - Abnormal; Notable for the following components:      Result Value   Glucose, Bld 102 (*)    All other components within normal limits  CBC - Abnormal; Notable for the following components:   WBC 10.6 (*)    Hemoglobin 11.2 (*)    HCT 32.4  (*)    MCV 73.8 (*)    MCH 25.5 (*)    RDW 16.3 (*)    All other components within normal limits  RESP PANEL BY RT-PCR (FLU A&B, COVID) ARPGX2  POC URINE PREG, ED  TROPONIN I (HIGH SENSITIVITY)  TROPONIN I (HIGH SENSITIVITY)     EKG  ED ECG REPORT I, 05/01/22, the attending physician, personally viewed and interpreted this ECG.  Date: 03/01/2022 EKG Time: 1958 Rate: 101 Rhythm: Sinus tachycardia QRS Axis: normal Intervals: normal ST/T Wave abnormalities: normal Narrative Interpretation: no evidence of acute ischemia    RADIOLOGY  Chest x-ray: I independently viewed and interpreted the images; there is a questionable left lingular infiltrate  CT angio chest: Subsegmental PE left lingula  PROCEDURES:  Critical Care performed: No  Procedures   MEDICATIONS ORDERED IN ED: Medications  iohexol (OMNIPAQUE) 350 MG/ML injection 75 mL (75 mLs Intravenous Contrast Given 03/01/22 2219)  azithromycin (ZITHROMAX) tablet 500 mg (500  mg Oral Given 03/01/22 2310)  apixaban (ELIQUIS) tablet 10 mg (10 mg Oral Given 03/01/22 2310)     IMPRESSION / MDM / ASSESSMENT AND PLAN / ED COURSE  I reviewed the triage vital signs and the nursing notes.  31 year old female with no active medical problems presents with left-sided chest and upper abdominal pain over the last day associated with some nausea.  The pain is pleuritic but is not associated with cough, shortness of breath, or fever.  Physical exam is overall unremarkable except for mild tachycardia.  The lungs are clear to auscultation.  EKG is nonischemic.  Differential diagnosis includes, but is not limited to, bronchitis, pneumonia, COVID-19 or other viral etiology, musculoskeletal chest wall pain, gastritis, PE.  Given the patient's age and lack of risk factors there is no clinical evidence for ACS.  Patient's presentation is most consistent with acute complicated illness / injury requiring diagnostic  workup.  Initial labs are reassuring.  The patient has borderline leukocytosis and normal electrolytes.  Initial troponin is negative.  Chest x-ray shows a questionable left lingular infiltrate.  I cannot rule out the patient for PE by PERC due to tachycardia.  We will obtain a CT angio to rule out PE and further evaluate the x-ray findings.  The patient is on the cardiac monitor to evaluate for evidence of arrhythmia and/or significant heart rate changes.  ----------------------------------------- 11:17 PM on 03/01/2022 -----------------------------------------  CT confirms left lingular infiltrate but also shows a small PE.  I did consider whether the patient may require admission, however she is low risk by PESI score.  She has no hypoxia, tachycardia has resolved, she has not required any pain medication, and is overall stable.  However, I have ordered lower extremity DVT studies to evaluate whether the patient has DVT or whether the PE is localized and more related to the acute pneumonia.    I signed the patient out to the oncoming ED physician Dr. Elesa Massed with plan for likely discharge.   FINAL CLINICAL IMPRESSION(S) / ED DIAGNOSES   Final diagnoses:  Pneumonia of left lung due to infectious organism, unspecified part of lung  Acute pulmonary embolism without acute cor pulmonale, unspecified pulmonary embolism type (HCC)     Rx / DC Orders   ED Discharge Orders          Ordered    APIXABAN (ELIQUIS) VTE STARTER PACK (10MG  AND 5MG )        03/01/22 2316    azithromycin (ZITHROMAX Z-PAK) 250 MG tablet        03/01/22 2316             Note:  This document was prepared using Dragon voice recognition software and may include unintentional dictation errors.    05/01/22, MD 03/01/22 [phone]

## 2022-03-02 NOTE — ED Provider Notes (Signed)
12:10 Am  Assumed care with patient at shift change.  Patient awaiting venous Dopplers.  Has a small pneumonia and PE on CTA chest but is a good candidate for outpatient management.  Azithromycin and Eliquis have already been sent to her pharmacy by previous provider.  Venous Dopplers reviewed and interpreted by myself and the radiologist and show no DVT.   Tailyn Hantz, Layla Maw, DO 03/02/22 [phone]

## 2022-03-07 ENCOUNTER — Encounter: Payer: Self-pay | Admitting: Student in an Organized Health Care Education/Training Program

## 2022-03-07 ENCOUNTER — Other Ambulatory Visit: Payer: Self-pay

## 2022-03-07 ENCOUNTER — Other Ambulatory Visit: Payer: Self-pay | Admitting: Student in an Organized Health Care Education/Training Program

## 2022-03-07 ENCOUNTER — Emergency Department: Payer: BC Managed Care – PPO

## 2022-03-07 ENCOUNTER — Emergency Department
Admission: EM | Admit: 2022-03-07 | Discharge: 2022-03-07 | Disposition: A | Discharge status: Home / Self Care | Payer: BC Managed Care – PPO | Attending: Emergency Medicine | Admitting: Emergency Medicine

## 2022-03-07 DIAGNOSIS — I2699 Other pulmonary embolism without acute cor pulmonale: Secondary | ICD-10-CM | POA: Diagnosis not present

## 2022-03-07 DIAGNOSIS — M25512 Pain in left shoulder: Secondary | ICD-10-CM | POA: Diagnosis not present

## 2022-03-07 DIAGNOSIS — R059 Cough, unspecified: Secondary | ICD-10-CM | POA: Diagnosis present

## 2022-03-07 DIAGNOSIS — J90 Pleural effusion, not elsewhere classified: Secondary | ICD-10-CM

## 2022-03-07 LAB — COMPREHENSIVE METABOLIC PANEL
ALT: 11 U/L (ref 0–44)
AST: 18 U/L (ref 15–41)
Albumin: 3.6 g/dL (ref 3.5–5.0)
Alkaline Phosphatase: 74 U/L (ref 38–126)
Anion gap: 10 (ref 5–15)
BUN: 11 mg/dL (ref 6–20)
CO2: 24 mmol/L (ref 22–32)
Calcium: 9.4 mg/dL (ref 8.9–10.3)
Chloride: 103 mmol/L (ref 98–111)
Creatinine, Ser: 0.77 mg/dL (ref 0.44–1.00)
GFR, Estimated: >60 mL/min (ref >60)
Glucose, Bld: 95 mg/dL (ref 70–99)
Potassium: 3.8 mmol/L (ref 3.5–5.1)
Sodium: 137 mmol/L (ref 135–145)
Total Bilirubin: 0.5 mg/dL (ref 0.3–1.2)
Total Protein: 8.2 g/dL — ABNORMAL HIGH (ref 6.5–8.1)

## 2022-03-07 LAB — CBC
HCT: 35.1 % — ABNORMAL LOW (ref 36.0–46.0)
Hemoglobin: 11.9 g/dL — ABNORMAL LOW (ref 12.0–15.0)
MCH: 24.8 pg — ABNORMAL LOW (ref 26.0–34.0)
MCHC: 33.9 g/dL (ref 30.0–36.0)
MCV: 73.3 fL — ABNORMAL LOW (ref 80.0–100.0)
Platelets: 299 10*3/uL (ref 150–400)
RBC: 4.79 MIL/uL (ref 3.87–5.11)
RDW: 15.9 % — ABNORMAL HIGH (ref 11.5–15.5)
WBC: 7.5 10*3/uL (ref 4.0–10.5)
nRBC: 0 % (ref 0.0–0.2)

## 2022-03-07 LAB — POC URINE PREG, ED: Preg Test, Ur: NEGATIVE

## 2022-03-07 MED ORDER — IOHEXOL 350 MG/ML SOLN
100.0000 mL | Freq: Once | INTRAVENOUS | Status: AC | PRN
  Administered 2022-03-07: 100 mL via INTRAVENOUS

## 2022-03-07 NOTE — ED Provider Notes (Signed)
[facility] Provider Note    Event Date/Time   First MD Initiated Contact with Patient 03/07/22 [phone]     (approximate)   History   Shoulder Pain   HPI  Lisa Bray is a 31 y.o. female who was seen last week for a small PE and a mild pneumonia.  She was treated outpatient with Eliquis and Zithromax.  She had had pain from her left collarbone down into her abdomen at that time.  Pain has improved almost completely resolved up until a couple days ago when she had a recurrence of the pain which was not as severe but again from the left collarbone down into the chest not into the belly this time.  Pain was again worse with deep breathing and felt just like her pain was a pulmonary embolus.  Additionally she has coughed up 2 small blood clots.  She is worried she may have recurrence of her symptoms so she came back in for check.      Physical Exam   Triage Vital Signs: ED Triage Vitals  Enc Vitals Group     BP 03/07/22 0737 (!) 141/100     Pulse Rate 03/07/22 0737 (!) 102     Resp 03/07/22 0737 19     Temp 03/07/22 0737 98.4 F (36.9 C)     Temp src --      SpO2 03/07/22 0737 100 %     Weight --      Height --      Head Circumference --      Peak Flow --      Pain Score 03/07/22 0735 2     Pain Loc --      Pain Edu? --      Excl. in GC? --     Most recent vital signs: Vitals:   03/07/22 0737 03/07/22 0745  BP: (!) 141/100 129/74  Pulse: (!) 102 95  Resp: 19   Temp: 98.4 F (36.9 C)   SpO2: 100% 100%     General: Awake, no distress.  CV:  Good peripheral perfusion.  Heart regular rate and rhythm no audible murmur Resp:  Normal effort.  Lungs are clear Abd:  No distention.  Soft and nontender Extremities no edema   ED Results / Procedures / Treatments   Labs (all labs ordered are listed, but only abnormal results are displayed) Labs Reviewed  COMPREHENSIVE METABOLIC PANEL - Abnormal; Notable for the following components:       Result Value   Total Protein 8.2 (*)    All other components within normal limits  CBC - Abnormal; Notable for the following components:   Hemoglobin 11.9 (*)    HCT 35.1 (*)    MCV 73.3 (*)    MCH 24.8 (*)    RDW 15.9 (*)    All other components within normal limits  ANA COMPREHENSIVE PANEL  ANTIPHOSPHOLIPID SYNDROME PROF  POC URINE PREG, ED     EKG  EKG read and interpreted by me shows normal sinus rhythm rate of 96 normal axis similar to prior   RADIOLOGY Chest x-ray today read and interpreted by me shows what appears to be an increasing effusion on the left lower lung.  Radiology has not read the film yet. CT of the chest read by radiologist reviewed by me is read as no new changes only consolidation of the lingular infiltrate has progressed.  See below:  1. Tiny nonocclusive pulmonary embolus again identified in  a  lingular branch of the left pulmonary artery. Probable additional  peripheral embolus in the lingula, similar to prior. No new or  progressive pulmonary embolic disease evident.  2. Interval evolution of the lingular infarct, now with more  consolidative appearance although size is similar to previous.  3. Interval progression of left pleural effusion, now moderate in  size.    PROCEDURES:  Critical Care performed:   Procedures   MEDICATIONS ORDERED IN ED: Medications  iohexol (OMNIPAQUE) 350 MG/ML injection 100 mL (100 mLs Intravenous Contrast Given 03/07/22 0832)     IMPRESSION / MDM / ASSESSMENT AND PLAN / ED COURSE  I reviewed the triage vital signs and the nursing notes.  I called pulmonary medicine.  Spoke with Dr.Dgayli who offered to come down and evaluate the patient.  He did so did an ultrasound thinks it is a small simple effusion with ultrasound and has arranged to follow her up in clinic.  He asked me to order an ANA comprehensive and phospholipid antibody test which I have done.  He feels the patient is okay for discharge and I will  discharge her. He is worried if she may have an autoimmune syndrome causing the pulmonary emboli and effusion etc.  He does not feel comfortable currently discontinuing the Eliquis to tap the effusion at this time.  He feels that we can wait for a little bit especially in view of the clinical stability of the patient with a normal white count unchanged essentially H&H afebrile not tachycardic or tachypneic with a good blood pressure.  Patient's presentation is most consistent with acute complicated illness / injury requiring diagnostic workup.  {he patient is on the cardiac monitor to evaluate for evidence of arrhythmia and/or significant heart rate changes.  None have been seen   FINAL CLINICAL IMPRESSION(S) / ED DIAGNOSES   Final diagnoses:  Left shoulder pain, unspecified chronicity  Pleural effusion  Other acute pulmonary embolism without acute cor pulmonale ([place])     Rx / DC Orders   ED Discharge Orders     None        Note:  This document was prepared using Dragon voice recognition software and may include unintentional dictation errors.   Nena Polio, MD 03/07/22 [phone]

## 2022-03-07 NOTE — Progress Notes (Signed)
Ordered chest xray for Monday.

## 2022-03-07 NOTE — ED Triage Notes (Addendum)
Pt comes with c/o left upper collarbone pain. Pt states some blood in sputum as well. Pt states she was recently seen here for same and it ended up being a blood clot and dx with pneumonia. Pt was started on blood thinner. Pt states it all got better.   Pt states yesterday she woke up feeling same pain again. Pt denies any SOB. Pt states little pain when she inhales.   Pt also states episode of lightheadedness and she got sweaty.

## 2022-03-07 NOTE — Consult Note (Signed)
NAME:  Lisa Bray, MRN:  [id number], DOB:  1990-12-27, LOS: 0 ADMISSION DATE:  03/07/2022, CONSULTATION DATE:  03/07/2022 REFERRING MD:  Conni Slipper, MD, CHIEF COMPLAINT:  Pleural Effusion   History of Present Illness:   Lisa Bray is a pleasant 31 year old female who presents to the ED today with a chief complaint of left shoulder pain.  Patient was recently seen in the ED for shortness of breath and chest pain and was diagnosed with pulmonary embolism and discharged on Eliquis.  She comes back in complaining of left-sided shoulder and neck pain. She reports two episodes of small hemoptysis (2 dime sized clots).  A CT scan with PE protocol was performed on 03/01/2022 and showed small pulmonary emboli within the lingula with associated groundglass and a small left-sided pleural effusion.  Repeat CT scan today showed again small pulmonary emboli in the lingula with an interval evolution of the lingular infarct and progression of the left-sided pleural effusion.  She tells me that she is not on OCPs or any other hormonal therapy.  She is a non-smoker, denies vaping or other illicit drug use.  She does not have any personal history of autoimmune disorders, and denies joint pains, rashes, Raynaud's phenomena, difficulty swallowing, or other blood clots. No family history of clots or auto-immune disorders.   Interim History / Subjective:  Feeling well, no chest pain, no chest tightness. Reports left sided shoulder pain.  Objective   Blood pressure 129/74, pulse 95, temperature 98.4 F (36.9 C), resp. rate 19, last menstrual period 02/23/2022, SpO2 100 %.       No intake or output data in the 24 hours ending 03/07/22 0950 There were no vitals filed for this visit.  Examination: Physical Exam Constitutional:      General: She is not in acute distress.    Appearance: Normal appearance. She is not ill-appearing.  HENT:     Nose: Nose normal.     Mouth/Throat:     Mouth: Mucous membranes  are moist.  Eyes:     Pupils: Pupils are equal, round, and reactive to light.  Cardiovascular:     Rate and Rhythm: Normal rate and regular rhythm.  Pulmonary:     Effort: Pulmonary effort is normal.     Breath sounds: Normal breath sounds.  Abdominal:     General: Abdomen is flat.     Palpations: Abdomen is soft.  Musculoskeletal:     Right lower leg: No edema.     Left lower leg: No edema.  Skin:    General: Skin is warm.  Neurological:     General: No focal deficit present.     Mental Status: She is alert and oriented to person, place, and time.       Assessment & Plan:  31 year old female presenting to the ED with left sided neck and shoulder pain in the setting of recently diagnosed pulmonary embolism. Repeat CT with PE protocol shows an evolving infarct in the left lingula with an associated pleural effusion. On my point of care ultrasound, the effusion is simple appearing and free flowing with no hematocrit signs. She is hemodynamically stable with no drop in her hemoglobin or hematocrit.  The differential for the pleural effusion is that of a reactive effusion associated with PE and lung infarct. Other etiologies on the differential include an inflammatory exudate associated with an undiagnosed auto-immune disorder and less likely a hemothorax.  She is currently on twice daily Apixaban 10 bid, and is  at significantly high risk for complication from attempted thoracentesis. She is not safe to have her anti-coagulation held due to her recent pulmonary embolism.  Would recommend a hyper-coagulability workup in addition to an auto-immune workup (ANA, anti-phospholipid syndrome). I will order an outpatient CXR for Monday and will see her in close follow up in clinic next week for repeat POCUS of the left sided pleural effusion.  Raechel Chute, MD [facility] 03/07/2022 10:09 AM     Labs   CBC: Recent Labs  Lab 03/01/22 1957 03/07/22 0738  WBC 10.6*  7.5  HGB 11.2* 11.9*  HCT 32.4* 35.1*  MCV 73.8* 73.3*  PLT 215 299    Basic Metabolic Panel: Recent Labs  Lab 03/01/22 1957 03/07/22 0738  NA 139 137  K 3.6 3.8  CL 104 103  CO2 27 24  GLUCOSE 102* 95  BUN 10 11  CREATININE 0.70 0.77  CALCIUM 8.9 9.4   GFR: Estimated Creatinine Clearance: 114.6 mL/min (by C-G formula based on SCr of 0.77 mg/dL). Recent Labs  Lab 03/01/22 1957 03/07/22 0738  WBC 10.6* 7.5    Liver Function Tests: Recent Labs  Lab 03/07/22 0738  AST 18  ALT 11  ALKPHOS 74  BILITOT 0.5  PROT 8.2*  ALBUMIN 3.6   No results for input(s): "LIPASE", "AMYLASE" in the last 168 hours. No results for input(s): "AMMONIA" in the last 168 hours.  ABG No results found for: "PHART", "PCO2ART", "PO2ART", "HCO3", "TCO2", "ACIDBASEDEF", "O2SAT"   Coagulation Profile: No results for input(s): "INR", "PROTIME" in the last 168 hours.  Cardiac Enzymes: No results for input(s): "CKTOTAL", "CKMB", "CKMBINDEX", "TROPONINI" in the last 168 hours.  HbA1C: No results found for: "HGBA1C"  CBG: No results for input(s): "GLUCAP" in the last 168 hours.  Review of Systems:   Review of Systems  Constitutional:  Negative for chills, fever and weight loss.  Respiratory:  Positive for hemoptysis (2 episodes). Negative for cough, sputum production, shortness of breath and wheezing.   Cardiovascular:  Negative for chest pain, palpitations, leg swelling and PND.  Neurological:  Negative for dizziness.    Past Medical History:  She,  has a past medical history of Neurogenic syncope and Recurrent upper respiratory infection (URI).   Surgical History:   Past Surgical History:  Procedure Laterality Date   BREAST BIOPSY Right      Social History:   reports that she has never smoked. She has never used smokeless tobacco. She reports that she does not drink alcohol and does not use drugs.   Family History:  Her family history includes Glaucoma in her father;  Hypertension in her father. There is no history of Migraines.   Allergies No Known Allergies   Home Medications  Prior to Admission medications   Medication Sig Start Date End Date Taking? Authorizing Provider  APIXABAN Everlene Balls) VTE STARTER PACK (10MG  AND 5MG ) Take as directed on package: start with two-5mg  tablets twice daily for 7 days. On day 8, switch to one-5mg  tablet twice daily. 03/01/22   , MD  cyclobenzaprine (FLEXERIL) 5 MG tablet Take 1 tablet (5 mg total) by mouth 3 (three) times daily as needed. 09/23/21   Menshew, Dionne Bucy, PA-C

## 2022-03-07 NOTE — Discharge Instructions (Signed)
Please continue the Eliquis.  Follow-up with Dr.Dgayli.  Call his office and make the appointment as he discussed with you.  Please return for any worsening of your pain or if you begin coughing up large amounts of blood or run a fever or gets short of breath.

## 2022-03-07 NOTE — ED Notes (Signed)
Full rainbow sent to lab.  

## 2022-03-09 ENCOUNTER — Telehealth: Payer: Self-pay

## 2022-03-09 ENCOUNTER — Ambulatory Visit
Admission: RE | Admit: 2022-03-09 | Discharge: 2022-03-09 | Disposition: A | Discharge status: Home / Self Care | Payer: BC Managed Care – PPO | Source: Ambulatory Visit | Attending: Student in an Organized Health Care Education/Training Program | Admitting: Student in an Organized Health Care Education/Training Program

## 2022-03-09 ENCOUNTER — Ambulatory Visit
Admission: RE | Admit: 2022-03-09 | Discharge: 2022-03-09 | Disposition: A | Discharge status: Home / Self Care | Payer: BC Managed Care – PPO | Attending: Student in an Organized Health Care Education/Training Program | Admitting: Student in an Organized Health Care Education/Training Program

## 2022-03-09 DIAGNOSIS — J90 Pleural effusion, not elsewhere classified: Secondary | ICD-10-CM | POA: Diagnosis present

## 2022-03-09 LAB — ANA COMPREHENSIVE PANEL
Anti JO-1: <0.2 AI (ref 0.0–0.9)
Centromere Ab Screen: <0.2 AI (ref 0.0–0.9)
Chromatin Ab SerPl-aCnc: <0.2 AI (ref 0.0–0.9)
ENA SM Ab Ser-aCnc: <0.2 AI (ref 0.0–0.9)
Ribonucleic Protein: <0.2 AI (ref 0.0–0.9)
SSA (Ro) (ENA) Antibody, IgG: 5.5 AI — ABNORMAL HIGH (ref 0.0–0.9)
SSB (La) (ENA) Antibody, IgG: <0.2 AI (ref 0.0–0.9)
Scleroderma (Scl-70) (ENA) Antibody, IgG: <0.2 AI (ref 0.0–0.9)
ds DNA Ab: 1 IU/mL (ref 0–9)

## 2022-03-09 NOTE — Telephone Encounter (Signed)
Patient called back- scheduled HFU 03/11/2022 at 11:30am.

## 2022-03-09 NOTE — Telephone Encounter (Signed)
Appt scheduled 03/11/2022 at 11:30.

## 2022-03-09 NOTE — Telephone Encounter (Signed)
-----   Message from Armando Reichert, MD sent at 03/07/2022 10:10 AM EDT ----- Regarding: pt for wednesday Hi Timmi Devora - can we schedule Lisa Bray for follow up with me on Wednesday. You can open any of my held slots. I saw her as a consult in the hospital today. Patient will need to be called to confirm time.  Thank you, [employer]

## 2022-03-09 NOTE — Telephone Encounter (Signed)
Routing to Dr. Dgayli to make aware. 

## 2022-03-09 NOTE — Telephone Encounter (Signed)
Lm for patient.  

## 2022-03-09 NOTE — Telephone Encounter (Signed)
Noted.  Will close encounter.  

## 2022-03-09 NOTE — ED Triage Notes (Signed)
Pt to Ed via POV from home. Pt recently seen and dx with pneumonia and small PE. Pt has been following up with pulmonologist that scheduled xray for today. Will d/c from ER and send pt to xray department for scheduled exam

## 2022-03-10 LAB — ANTIPHOSPHOLIPID SYNDROME PROF
Anticardiolipin IgG: <9 GPL U/mL (ref 0–14)
Anticardiolipin IgM: 11 MPL U/mL (ref 0–12)
DRVVT: 44.6 s (ref 0.0–47.0)
PTT Lupus Anticoagulant: 35 s (ref 0.0–43.5)

## 2022-03-11 ENCOUNTER — Encounter: Payer: Self-pay | Admitting: Student in an Organized Health Care Education/Training Program

## 2022-03-11 ENCOUNTER — Ambulatory Visit (INDEPENDENT_AMBULATORY_CARE_PROVIDER_SITE_OTHER): Payer: BC Managed Care – PPO | Admitting: Student in an Organized Health Care Education/Training Program

## 2022-03-11 VITALS — BP 118/74 | HR 90 | Ht 66.0 in | Wt 190.2 lb

## 2022-03-11 DIAGNOSIS — I2699 Other pulmonary embolism without acute cor pulmonale: Secondary | ICD-10-CM | POA: Diagnosis not present

## 2022-03-11 DIAGNOSIS — J90 Pleural effusion, not elsewhere classified: Secondary | ICD-10-CM | POA: Diagnosis not present

## 2022-03-11 NOTE — Patient Instructions (Signed)
Today, we discussed your pleural effusion. I will see you for follow up in 1 week to do another ultrasound and discuss possible sampling. In the interim, I will refer you to rheumatology and hematology.

## 2022-03-11 NOTE — Progress Notes (Signed)
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 On my point of care ultrasound evaluation of the pleural effusion (9/16 & 9/20), the fluid appears to be free flowing and simple appearing. She has tolerated the Eliquis well and her symptoms have improved. Workup sent has so far included blood work for anti-phospholipid which has been negative and for SLE which has returned a positive SSA (Anti-Ro) antibody. ANA was not sent as part of the SLE panel.  The source of her pleural effusion at this point is unclear. The differentials include an effusion associated with pulmonary embolism, a hemorrhagic effusion, or an inflammatory exudate. It is possible that she had a pulmonary embolism resulting in lung infarction (noted on CT) followed by the development of a pleural effusion. It is also possible she developed pleuritis as the initial presentation of an auto-immune disorder resulting in the development of a pleural effusion triggering a pulmonary embolus.  I will refer Lisa Bray to rheumatology for an evaluation of her SSA as well as to Hematology for a hypercoagulability workup. I did discuss with her that the next step in the workup and management of the pleural effusion is to proceed with a thoracentesis to drain the effusion and send for analysis. I had held off on performing it over the weekend as she was on twice daily 10 mg of Apixaban. I explained the risks of benefits for the procedure (risk of bleeding, pneumothorax, infection) vs. Watchful waiting with repeat ultrasound. Ms. Druckenmiller would prefer to take the watchful waiting approach. I will see her in follow up in 1 week for repeat ultrasound for the re-evaluation  of the pleural effusion.  - Ambulatory referral to Hematology / Oncology - Ambulatory referral to Rheumatology - Continue Eliquis 5 mg bid - follow up in 1 week for repeat US   Return in about 1 week (around 03/18/2022).  I spent 30 minutes caring for this patient today, including preparing to see the patient, obtaining and/or reviewing separately obtained history, performing a medically appropriate examination and/or evaluation, counseling and educating the patient/family/caregiver, documenting clinical information in the electronic health record, and independently interpreting results (not separately reported/billed) and communicating results to the patient/family/caregiver  Raechel Chute, MD [facility] 03/11/2022 12:58 PM    End of visit medications:  No orders of the defined types were placed in this encounter.    Current Outpatient Medications:    apixaban (ELIQUIS) 5 MG TABS tablet, Take 5 mg by mouth 2 (two) times daily., Disp: , Rfl:    Subjective:   PATIENT ID: Lisa Bray GENDER: female DOB: May 15, 1991, MRN: [id number]  Chief Complaint  Patient presents with   Hospital Followup     Breathing doing well. She's taking the Eliquis 5 mg bid. Had minimal hemoptysis x 2 days ago.     HPI  Lisa Bray is a pleasant 31 year old female presenting for clinic for follow up on pleural effusion.  She was first seen in the ED on 03/01/2022 for chest pain and worked up with a CT chest with PE protocol showing a left sided pleural effusion in addition to a small lingular pulmonary embolus. She was started on Apixaban and discharged. She re-presented on 03/07/2022 with  left sided shoulder pain. Repeat CT chest showed small lingular pulmonary embolus and a growth in the size of the pleural effusion. I saw Lisa Bray in the ED on 03/07/2022 for the evaluation of her effusion.  At the time of my initial evaluation, her symptoms were beginning to improve. She is a  non-smoker, and denies vaping. She is not on OCP or other hormonal therapy. She reports no personal or family history of auto-immune disorders, and denies joint pains, rashes, raynaud's phenomena, difficulty swallowing, or personal history of blood clots. She is not sexually active and has not had any miscarriages. There is no family history of repeat miscarriages. She reported one episode of coughing up a Nickle sized piece of old blood mixed with sputum last week and again on Monday. She has tolerated the Eliquis well.   Ancillary information including prior medications, full medical/surgical/family/social histories, and PFTs (when available) are listed below and have been reviewed.   Review of Systems  Constitutional:  Negative for chills, fever, malaise/fatigue and weight loss.  Respiratory:  Positive for hemoptysis (minimal, dime sized). Negative for cough and sputum production.   Cardiovascular:  Negative for chest pain, leg swelling and PND.  Gastrointestinal:  Negative for heartburn and nausea.  Neurological:  Negative for dizziness and focal weakness.     Objective:   Vitals:   03/11/22 1114  BP: 118/74  Pulse: 90  SpO2: 97%  Weight: 190 lb 3.2 oz (86.3 kg)  Height: 5\' 6"  (1.676 m)   97% on RA BMI Readings from Last 3 Encounters:  03/11/22 30.70 kg/m  03/01/22 31.15 kg/m  09/23/21 25.83 kg/m   Wt Readings from Last 3 Encounters:  03/11/22 190 lb 3.2 oz (86.3 kg)  03/01/22 193 lb (87.5 kg)  09/23/21 160 lb 0.9 oz (72.6 kg)    Physical Exam Constitutional:      Appearance: She is normal weight.  HENT:     Head: Normocephalic.     Right Ear: Tympanic membrane normal.     Nose: Nose normal.     Mouth/Throat:     Mouth: Mucous membranes are moist.  Eyes:     Extraocular Movements: Extraocular movements intact.     Pupils: Pupils are equal, round, and reactive to light.  Cardiovascular:     Rate and Rhythm: Normal rate and regular rhythm.     Pulses: Normal  pulses.     Heart sounds: Normal heart sounds.  Pulmonary:     Effort: Pulmonary effort is normal.     Breath sounds: Rales (over the left lower lung field) present.  Musculoskeletal:        General: Normal range of motion.     Cervical back: Normal range of motion.  Neurological:     General: No focal deficit present.     Mental Status: She is alert and oriented to person, place, and time. Mental status is at baseline.    Point of Care Ultrasound of the Left pleural space showing a simple appearing pleural effusion      US of the left pleural space showing consolidated/hepatized appearing LLL:    Ancillary Information    Past Medical History:  Diagnosis Date   Neurogenic syncope    Recurrent upper respiratory infection (URI)      Family History  Problem Relation Age of Onset   Hypertension Father    Glaucoma Father    Migraines Neg Hx      Past Surgical History:  Procedure Laterality Date  BREAST BIOPSY Right     Social History   Socioeconomic History   Marital status: Single    Spouse name: Not on file   Number of children: 0   Years of education: BS   Highest education level: Not on file  Occupational History   Occupation: [employer]  Tobacco Use   Smoking status: Never   Smokeless tobacco: Never  Vaping Use   Vaping Use: Never used  Substance and Sexual Activity   Alcohol use: No   Drug use: No   Sexual activity: Not on file  Other Topics Concern   Not on file  Social History Narrative   Lives at home, single   Right-handed   No caffeine   Social Determinants of Health   Financial Resource Strain: Not on file  Food Insecurity: Not on file  Transportation Needs: Not on file  Physical Activity: Not on file  Stress: Not on file  Social Connections: Not on file  Intimate Partner Violence: Not on file     No Known Allergies   CBC    Component Value Date/Time   WBC 7.5 03/07/2022 0738   RBC 4.79 03/07/2022 0738   HGB 11.9 (L)  03/07/2022 0738   HCT 35.1 (L) 03/07/2022 0738   PLT 299 03/07/2022 0738   MCV 73.3 (L) 03/07/2022 0738   MCH 24.8 (L) 03/07/2022 0738   MCHC 33.9 03/07/2022 0738   RDW 15.9 (H) 03/07/2022 0738   LYMPHSABS 1.9 07/28/2019 1624   MONOABS 0.6 07/28/2019 1624   EOSABS 0.1 07/28/2019 1624   BASOSABS 0.0 07/28/2019 1624    Pulmonary Functions Testing Results:     No data to display          Outpatient Medications Prior to Visit  Medication Sig Dispense Refill   apixaban (ELIQUIS) 5 MG TABS tablet Take 5 mg by mouth 2 (two) times daily.     APIXABAN (ELIQUIS) VTE STARTER PACK (10MG  AND 5MG ) Take as directed on package: start with two-5mg  tablets twice daily for 7 days. On day 8, switch to one-5mg  tablet twice daily. 1 each 0   cyclobenzaprine (FLEXERIL) 5 MG tablet Take 1 tablet (5 mg total) by mouth 3 (three) times daily as needed. 15 tablet 0   No facility-administered medications prior to visit.

## 2022-03-14 ENCOUNTER — Other Ambulatory Visit: Payer: Self-pay

## 2022-03-14 ENCOUNTER — Encounter: Payer: Self-pay | Admitting: Emergency Medicine

## 2022-03-14 DIAGNOSIS — R0602 Shortness of breath: Secondary | ICD-10-CM | POA: Insufficient documentation

## 2022-03-14 DIAGNOSIS — R42 Dizziness and giddiness: Secondary | ICD-10-CM | POA: Diagnosis present

## 2022-03-14 DIAGNOSIS — E869 Volume depletion, unspecified: Secondary | ICD-10-CM | POA: Diagnosis not present

## 2022-03-14 DIAGNOSIS — Z7901 Long term (current) use of anticoagulants: Secondary | ICD-10-CM | POA: Diagnosis not present

## 2022-03-14 LAB — BASIC METABOLIC PANEL
Anion gap: 8 (ref 5–15)
BUN: 12 mg/dL (ref 6–20)
CO2: 26 mmol/L (ref 22–32)
Calcium: 9.4 mg/dL (ref 8.9–10.3)
Chloride: 105 mmol/L (ref 98–111)
Creatinine, Ser: 0.84 mg/dL (ref 0.44–1.00)
GFR, Estimated: >60 mL/min (ref >60)
Glucose, Bld: 97 mg/dL (ref 70–99)
Potassium: 3.8 mmol/L (ref 3.5–5.1)
Sodium: 139 mmol/L (ref 135–145)

## 2022-03-14 LAB — CBC
HCT: 35.3 % — ABNORMAL LOW (ref 36.0–46.0)
Hemoglobin: 12 g/dL (ref 12.0–15.0)
MCH: 24.9 pg — ABNORMAL LOW (ref 26.0–34.0)
MCHC: 34 g/dL (ref 30.0–36.0)
MCV: 73.2 fL — ABNORMAL LOW (ref 80.0–100.0)
Platelets: 304 10*3/uL (ref 150–400)
RBC: 4.82 MIL/uL (ref 3.87–5.11)
RDW: 16.1 % — ABNORMAL HIGH (ref 11.5–15.5)
WBC: 8.7 10*3/uL (ref 4.0–10.5)
nRBC: 0 % (ref 0.0–0.2)

## 2022-03-14 NOTE — ED Triage Notes (Signed)
Pt presents to ER from home reports she was diagnosed with blood clots in her left lung reports feeling lightheaded and short of breath. Pt talks in complete sentences no distress noted

## 2022-03-15 ENCOUNTER — Emergency Department
Admission: EM | Admit: 2022-03-15 | Discharge: 2022-03-15 | Disposition: A | Discharge status: Home / Self Care | Payer: BC Managed Care – PPO | Attending: Emergency Medicine | Admitting: Emergency Medicine

## 2022-03-15 DIAGNOSIS — E869 Volume depletion, unspecified: Secondary | ICD-10-CM

## 2022-03-15 DIAGNOSIS — R42 Dizziness and giddiness: Secondary | ICD-10-CM

## 2022-03-15 LAB — URINALYSIS, ROUTINE W REFLEX MICROSCOPIC
Bilirubin Urine: NEGATIVE
Glucose, UA: NEGATIVE mg/dL
Ketones, ur: 15 mg/dL — AB
Leukocytes,Ua: NEGATIVE
Nitrite: NEGATIVE
Protein, ur: NEGATIVE mg/dL
Specific Gravity, Urine: 1.015 (ref 1.005–1.030)
pH: 7 (ref 5.0–8.0)

## 2022-03-15 LAB — URINALYSIS, MICROSCOPIC (REFLEX)

## 2022-03-15 LAB — TROPONIN I (HIGH SENSITIVITY)
Troponin I (High Sensitivity): <2 ng/L (ref <18)
Troponin I (High Sensitivity): <2 ng/L (ref <18)

## 2022-03-15 NOTE — ED Notes (Signed)
Pt states that she started eliquis Sept 10, was reassessed here in ED Friday and no improvement.

## 2022-03-15 NOTE — ED Provider Notes (Signed)
[facility] Provider Note    Event Date/Time   First MD Initiated Contact with Patient 03/15/22 [phone]     (approximate)   History   Shortness of Breath   HPI  Lisa Bray is a 31 y.o. female who was recently diagnosed with small pulmonary emboli and started on Eliquis.  She presents tonight for evaluation of lightheadedness and a brief episode of shortness of breath.  She has been following up with pulmonary medicine and has an appointment in 3 days in clinic.  She has had a couple of visits to the emergency department with concerns for either recurrent chest pain or shortness of breath.  She said that in general she feels much better than she did when she was first diagnosed.  She is not having pain when she breathes.  She is generally taking it easy and not exerting herself.  She does not smoke, use drugs, or nor drink alcohol.  She has not had any recent fever, cough, nausea, vomiting, nor abdominal pain.  She said that today she has felt lightheaded, however, and that she felt little bit short of breath earlier this evening, so she decided to come to the emergency department to get checked out.  She still feels a bit lightheaded but no longer feels short of breath.     Physical Exam   Triage Vital Signs: ED Triage Vitals  Enc Vitals Group     BP 03/14/22 2324 (!) 128/107     Pulse Rate 03/14/22 2324 96     Resp 03/14/22 2324 16     Temp 03/14/22 2324 97.9 F (36.6 C)     Temp Source 03/14/22 2324 Oral     SpO2 03/14/22 2324 99 %     Weight 03/14/22 2325 86.2 kg (190 lb)     Height 03/14/22 2325 1.676 m (5\' 6" )     Head Circumference --      Peak Flow --      Pain Score 03/14/22 2324 0     Pain Loc --      Pain Edu? --      Excl. in GC? --     Most recent vital signs: Vitals:   03/15/22 0228 03/15/22 0410  BP: 136/88 (!) 143/80  Pulse: 74 91  Resp: 16 (!) 22  Temp: 98.4 F (36.9 C)   SpO2: 100% 100%     General: Awake, no  distress.  Well-appearing. CV:  Good peripheral perfusion.  Normal heart sounds.  Regular rate and rhythm. Resp:  Normal effort.  No accessory muscle usage or intercostal muscle retractions.  Lungs are clear to auscultation bilaterally with good air movement. Abd:  No distention.  Other:  Mood and affect are normal and appropriate, very polite and conversant, no indication of anxiety or adjustment/mood disorder.   ED Results / Procedures / Treatments   Labs (all labs ordered are listed, but only abnormal results are displayed) Labs Reviewed  CBC - Abnormal; Notable for the following components:      Result Value   HCT 35.3 (*)    MCV 73.2 (*)    MCH 24.9 (*)    RDW 16.1 (*)    All other components within normal limits  URINALYSIS, ROUTINE W REFLEX MICROSCOPIC - Abnormal; Notable for the following components:   Hgb urine dipstick MODERATE (*)    Ketones, ur 15 (*)    All other components within normal limits  URINALYSIS, MICROSCOPIC (REFLEX) - Abnormal; Notable  for the following components:   Bacteria, UA RARE (*)    All other components within normal limits  BASIC METABOLIC PANEL  TROPONIN I (HIGH SENSITIVITY)  TROPONIN I (HIGH SENSITIVITY)     EKG  ED ECG REPORT I, Hinda Kehr, the attending physician, personally viewed and interpreted this ECG.  Date: 03/14/2022 EKG Time: 23: 26 Rate: 85 Rhythm: normal sinus rhythm QRS Axis: normal Intervals: normal ST/T Wave abnormalities: normal Narrative Interpretation: no evidence of acute ischemia    PROCEDURES:  Critical Care performed: No  Procedures   MEDICATIONS ORDERED IN ED: Medications - No data to display   IMPRESSION / MDM / [place] / ED COURSE  I reviewed the triage vital signs and the nursing notes.                              Differential diagnosis includes, but is not limited to, persistent pulmonary embolism, pneumonia, pleural effusion, pneumothorax, anxiety, ACS, worsening PE/clot  burden.  Patient's presentation is most consistent with acute presentation with potential threat to life or bodily function.  However, the patient's evaluation has been very reassuring.  She was initially slightly tachypneic upon arrival but I think that this was likely at least some degree of anxiety.  She was very calm and relaxed when I evaluated her.  She is not tachycardic, tachypneic, hypotensive, febrile, nor hypoxemic.  She is 100% on room air, breathing easily and comfortably, with good air movement.  She is not having any pain.  Labs/studies ordered: CBC, basic metabolic panel, high-sensitivity troponin x2, urinalysis.  Labs are all reassuring.  Her hemoglobin is stable, electrolytes are normal, high-sensitivity troponin within normal limits x2, EKG is normal.  She has a little bit of hemoglobinuria which could be the result of being on Eliquis; she says she is not currently on her menstrual cycle.  She is not having any pain and no vital sign abnormalities.  She has been compliant with her Eliquis.  We had a long talk about her symptoms and I point out that she has some ketones in her urine which could mean that she is a little bit dehydrated or volume depleted.  I offered to have an IV placed in to give her a liter of fluids but she would rather drink additional oral fluid.  I explained I do not have a specific cause of her lightheadedness, but at this point I think that repeated CTAs are not beneficial and in fact would be more harmful in the long run rather than following up as an outpatient on Wednesday.  She understands and will follow-up as planned.  I gave my usual and customary return precautions.      FINAL CLINICAL IMPRESSION(S) / ED DIAGNOSES   Final diagnoses:  Lightheadedness  Volume depletion     Rx / DC Orders   ED Discharge Orders     None        Note:  This document was prepared using Dragon voice recognition software and may include unintentional  dictation errors.   Hinda Kehr, MD 03/15/22 [phone]

## 2022-03-15 NOTE — ED Notes (Signed)
Lab called again to add troponin.  

## 2022-03-15 NOTE — ED Notes (Signed)
Lab called to add on troponin 

## 2022-03-15 NOTE — ED Notes (Signed)
EDP [place] in room at this time. 

## 2022-03-15 NOTE — ED Notes (Signed)
Pt states that [facility] has resolved at this time but she still feels lightheaded.  Last dose of Eliquis due at 2315 5mg  not taken d/t being in ED tonight.

## 2022-03-15 NOTE — Discharge Instructions (Addendum)
As we discussed your evaluation was reassuring and you have no obvious acute abnormalities.  You may be a little bit dehydrated, and we encourage you to drink plenty of fluids.  We recommend that you continue taking your medications as we discussed and follow-up with your pulmonologist on Wednesday as scheduled.  Return to the emergency department if you develop new or worsening symptoms that concern you.

## 2022-03-18 ENCOUNTER — Encounter: Payer: Self-pay | Admitting: Student in an Organized Health Care Education/Training Program

## 2022-03-18 ENCOUNTER — Ambulatory Visit (INDEPENDENT_AMBULATORY_CARE_PROVIDER_SITE_OTHER): Payer: BC Managed Care – PPO | Admitting: Student in an Organized Health Care Education/Training Program

## 2022-03-18 VITALS — BP 118/80 | HR 90 | Temp 97.8°F | Ht 66.0 in | Wt 188.6 lb

## 2022-03-18 DIAGNOSIS — J90 Pleural effusion, not elsewhere classified: Secondary | ICD-10-CM | POA: Diagnosis not present

## 2022-03-18 DIAGNOSIS — I2699 Other pulmonary embolism without acute cor pulmonale: Secondary | ICD-10-CM | POA: Diagnosis not present

## 2022-03-18 NOTE — Progress Notes (Signed)
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 On my point of care ultrasound evaluation of the pleural effusion (9/16 & 9/20), the fluid appeared to be free flowing and simple appearing. Today, I am reassured by the repeat POCUS showing the fluid to be mostly resolved, with only a trace left.  Overall, she has tolerated Eliquis well and symptoms have mostly resolved. She does report some lightheadedness with it which is concerning for her. Fortunately, workup has been re-assuring (resolving effusion on POCUS, normal H/H).   Workup for cause behind the PE has so far included blood work for anti-phospholipid which has been negative and for SLE which has returned a positive SSA (Anti-Ro) antibody. ANA was not sent as part of the SLE panel.   The source of her pleural effusion at this point is unclear. The differentials include an effusion associated with pulmonary embolism, or an inflammatory exudate. It is possible that she had a pulmonary embolism resulting in lung infarction (noted on CT) followed by the development of a pleural effusion. It is also possible she developed pleuritis as the initial presentation of an auto-immune disorder resulting in the development of a pleural effusion triggering a pulmonary embolus.   I have referred Lisa Bray to rheumatology for an evaluation of her SSA as well as to Hematology for a hypercoagulability workup. During our last visit, following shared decision making we decided to not pursue a thoracentesis and decided to monitor the effusion. Today, the pleural effusion is mostly resolved.  Finally, we did have a conversation today regarding duration of  treatment. Understandably, Lisa Bray is concerned about potential side effects of Apixaban. I did explain current ACCP guidelines pertaining to treatment of unprovoked PE recommending extended phase anti-coagulation to decrease the risk of recurrence given the (as of now) unprovoked nature of her PE. Given Lisa Bray' concern, we will revisit this on follow up in 3 months.   - Ambulatory referral to Hematology / Oncology - Ambulatory referral to Rheumatology - Continue Eliquis 5 mg bid   Return in about 3 months (around 06/17/2022).  I spent 30 minutes caring for this patient today, including preparing to see the patient, obtaining and/or reviewing separately obtained history, performing a medically appropriate examination and/or evaluation, counseling and educating the patient/family/caregiver, documenting clinical information in the electronic health record, and independently interpreting results (not separately reported/billed) and communicating results to the patient/family/caregiver  Raechel Chute, MD [facility] 03/18/2022 9:17 AM    End of visit medications:  No orders of the defined types were placed in this encounter.    Current Outpatient Medications:    apixaban (ELIQUIS) 5 MG TABS tablet, Take 5 mg by mouth 2 (two) times daily., Disp: , Rfl:    hydroquinone 4 % cream, Apply topically at bedtime., Disp: , Rfl:    Salicylic Acid (DERMACINRX ATRIX ANTIBAC WASH) 2 % LIQD, Lather this product on face and wash off thoroughly every morning and evening., Disp: , Rfl:    Subjective:   PATIENT ID: Lisa Bray GENDER: female DOB: [birth year], MRN: [id number]  Chief Complaint  Patient presents with   Follow-up    Pleural effusion. Doing better. Slight pain when inhaling.  HPI  Lisa Bray is a 31 year old female with a recent diagnosis of unprovoked PE and associated small left sided pleural effusion who is presenting to clinic today for follow  up.  She was first seen in the ED on 03/01/2022 for chest pain and worked up with a CT chest with PE protocol showed a left sided pleural effusion in addition to a small lingular pulmonary embolus. She was started on Apixaban and discharged. She re-presented on 03/07/2022 with left sided shoulder pain. Repeat CT chest showed small lingular pulmonary embolus and a growth in the size of the pleural effusion.   At the time of my initial evaluation, her symptoms were beginning to improve. She is a non-smoker, and denies vaping. She is not on OCP or other hormonal therapy. She reports no personal or family history of auto-immune disorders, and denies joint pains, rashes, raynaud's phenomena, difficulty swallowing, or personal history of blood clots. She is not sexually active and has not had any miscarriages. There is no family history of repeat miscarriages.  Since our last visit, she denies any hemoptysis and reports the symptoms related to the PE have improved. She feels 1 out of 10 discomfort on deep breaths but that is not bothersome to her. She reports a sensation of lightheadedness that brought her to the ED recently. She underwent an evaluation that was only notable for mild hematuria. She is concerned about the long term effects of Eliquis.  Ancillary information including prior medications, full medical/surgical/family/social histories, and PFTs (when available) are listed below and have been reviewed.   Review of Systems  Constitutional:  Negative for chills, fever, malaise/fatigue and weight loss.  Respiratory:  Negative for cough, hemoptysis (minimal, dime sized) and sputum production.   Cardiovascular:  Negative for chest pain, leg swelling and PND.  Gastrointestinal:  Negative for heartburn and nausea.  Neurological:  Positive for dizziness (light headed at times). Negative for focal weakness.     Objective:   Vitals:   03/18/22 0828  BP: 118/80  Pulse: 90  Temp: 97.8 F (36.6 C)   SpO2: 100%  Weight: 188 lb 9.6 oz (85.5 kg)  Height: 5\' 6"  (1.676 m)   100% on RA  BMI Readings from Last 3 Encounters:  03/18/22 30.44 kg/m  03/14/22 30.67 kg/m  03/11/22 30.70 kg/m   Wt Readings from Last 3 Encounters:  03/18/22 188 lb 9.6 oz (85.5 kg)  03/14/22 190 lb (86.2 kg)  03/11/22 190 lb 3.2 oz (86.3 kg)    Physical Exam Constitutional:      Appearance: She is normal weight.  HENT:     Head: Normocephalic.     Right Ear: Tympanic membrane normal.     Nose: Nose normal.     Mouth/Throat:     Mouth: Mucous membranes are moist.  Eyes:     Extraocular Movements: Extraocular movements intact.     Pupils: Pupils are equal, round, and reactive to light.  Cardiovascular:     Rate and Rhythm: Normal rate and regular rhythm.     Pulses: Normal pulses.     Heart sounds: Normal heart sounds.  Pulmonary:     Effort: Pulmonary effort is normal.     Breath sounds: Rales (left lower lung field) present.  Musculoskeletal:        General: Normal range of motion.     Cervical back: Normal range of motion.  Neurological:     General: No focal deficit present.     Mental  Status: She is alert and oriented to person, place, and time. Mental status is at baseline.    POCUS today: trace simple appearing pleural effusion noted over the left base.  Chest Point of Care Ultrasound  Patient name: Lisa Bray MRN: [id number] DOB: 05-19-1991  Indication: Left sided pleural effusion  We performed ultrasound of Left Posterolateral hemithorax,for assessment of presence, volume and sonographic appearance of pleural fluid and sonographic apperance of lung in preparation for sampling of pleural fluid/insertion of catheter into pleural space.  Relevant sonographic findings:  Trace amount of pleural fluid noted on isonation. Sonographic appearance of fluid was homogeneously anechoic. Visualized lung parenchyma was noted to be moving freely with respiration.  Impression:  Trace  pleural effusion that is simple in nature.  No further sonographic surveillance needed.  Ancillary Information    Past Medical History:  Diagnosis Date   Neurogenic syncope    Recurrent upper respiratory infection (URI)      Family History  Problem Relation Age of Onset   Hypertension Father    Glaucoma Father    Migraines Neg Hx      Past Surgical History:  Procedure Laterality Date   BREAST BIOPSY Right     Social History   Socioeconomic History   Marital status: Single    Spouse name: Not on file   Number of children: 0   Years of education: BS   Highest education level: Not on file  Occupational History   Occupation: [employer]  Tobacco Use   Smoking status: Never   Smokeless tobacco: Never  Vaping Use   Vaping Use: Never used  Substance and Sexual Activity   Alcohol use: No   Drug use: No   Sexual activity: Not on file  Other Topics Concern   Not on file  Social History Narrative   Lives at home, single   Right-handed   No caffeine   Social Determinants of Health   Financial Resource Strain: Not on file  Food Insecurity: Not on file  Transportation Needs: Not on file  Physical Activity: Not on file  Stress: Not on file  Social Connections: Not on file  Intimate Partner Violence: Not on file     No Known Allergies   CBC    Component Value Date/Time   WBC 8.7 03/14/2022 2328   RBC 4.82 03/14/2022 2328   HGB 12.0 03/14/2022 2328   HCT 35.3 (L) 03/14/2022 2328   PLT 304 03/14/2022 2328   MCV 73.2 (L) 03/14/2022 2328   MCH 24.9 (L) 03/14/2022 2328   MCHC 34.0 03/14/2022 2328   RDW 16.1 (H) 03/14/2022 2328   LYMPHSABS 1.9 07/28/2019 1624   MONOABS 0.6 07/28/2019 1624   EOSABS 0.1 07/28/2019 1624   BASOSABS 0.0 07/28/2019 1624    Pulmonary Functions Testing Results:     No data to display          Outpatient Medications Prior to Visit  Medication Sig Dispense Refill   apixaban (ELIQUIS) 5 MG TABS tablet Take 5 mg by mouth 2 (two)  times daily.     hydroquinone 4 % cream Apply topically at bedtime.     Salicylic Acid (DERMACINRX ATRIX ANTIBAC WASH) 2 % LIQD Lather this product on face and wash off thoroughly every morning and evening.     No facility-administered medications prior to visit.

## 2022-03-23 ENCOUNTER — Inpatient Hospital Stay: Payer: BC Managed Care – PPO

## 2022-03-23 ENCOUNTER — Inpatient Hospital Stay: Payer: BC Managed Care – PPO | Attending: Oncology | Admitting: Oncology

## 2022-03-23 ENCOUNTER — Encounter: Payer: Self-pay | Admitting: Oncology

## 2022-03-23 VITALS — BP 120/83 | HR 89 | Temp 98.1°F | Resp 16 | Ht 66.0 in | Wt 188.8 lb

## 2022-03-23 DIAGNOSIS — D7589 Other specified diseases of blood and blood-forming organs: Secondary | ICD-10-CM | POA: Insufficient documentation

## 2022-03-23 DIAGNOSIS — Z7901 Long term (current) use of anticoagulants: Secondary | ICD-10-CM | POA: Diagnosis not present

## 2022-03-23 DIAGNOSIS — R718 Other abnormality of red blood cells: Secondary | ICD-10-CM

## 2022-03-23 DIAGNOSIS — I2699 Other pulmonary embolism without acute cor pulmonale: Secondary | ICD-10-CM | POA: Insufficient documentation

## 2022-03-23 DIAGNOSIS — D582 Other hemoglobinopathies: Secondary | ICD-10-CM | POA: Insufficient documentation

## 2022-03-23 LAB — CBC WITH DIFFERENTIAL/PLATELET
Abs Immature Granulocytes: 0.02 10*3/uL (ref 0.00–0.07)
Basophils Absolute: 0 10*3/uL (ref 0.0–0.1)
Basophils Relative: 0 %
Eosinophils Absolute: 0.1 10*3/uL (ref 0.0–0.5)
Eosinophils Relative: 1 %
HCT: 33.5 % — ABNORMAL LOW (ref 36.0–46.0)
Hemoglobin: 11.8 g/dL — ABNORMAL LOW (ref 12.0–15.0)
Immature Granulocytes: 0 %
Lymphocytes Relative: 32 %
Lymphs Abs: 1.6 10*3/uL (ref 0.7–4.0)
MCH: 25.6 pg — ABNORMAL LOW (ref 26.0–34.0)
MCHC: 35.2 g/dL (ref 30.0–36.0)
MCV: 72.7 fL — ABNORMAL LOW (ref 80.0–100.0)
Monocytes Absolute: 0.4 10*3/uL (ref 0.1–1.0)
Monocytes Relative: 8 %
Neutro Abs: 3 10*3/uL (ref 1.7–7.7)
Neutrophils Relative %: 59 %
Platelets: 233 10*3/uL (ref 150–400)
RBC: 4.61 MIL/uL (ref 3.87–5.11)
RDW: 16.1 % — ABNORMAL HIGH (ref 11.5–15.5)
WBC: 5.1 10*3/uL (ref 4.0–10.5)
nRBC: 0 % (ref 0.0–0.2)

## 2022-03-23 LAB — HEPATIC FUNCTION PANEL
ALT: 13 U/L (ref 0–44)
AST: 22 U/L (ref 15–41)
Albumin: 4 g/dL (ref 3.5–5.0)
Alkaline Phosphatase: 83 U/L (ref 38–126)
Bilirubin, Direct: <0.1 mg/dL (ref 0.0–0.2)
Total Bilirubin: 0.6 mg/dL (ref 0.3–1.2)
Total Protein: 8.1 g/dL (ref 6.5–8.1)

## 2022-03-23 LAB — RETIC PANEL
Immature Retic Fract: 6.8 % (ref 2.3–15.9)
RBC.: 4.61 MIL/uL (ref 3.87–5.11)
Retic Count, Absolute: 40.6 10*3/uL (ref 19.0–186.0)
Retic Ct Pct: 0.9 % (ref 0.4–3.1)
Reticulocyte Hemoglobin: 31.1 pg (ref >27.9)

## 2022-03-23 LAB — IRON AND TIBC
Iron: 64 ug/dL (ref 28–170)
Saturation Ratios: 17 % (ref 10.4–31.8)
TIBC: 367 ug/dL (ref 250–450)
UIBC: 303 ug/dL

## 2022-03-23 LAB — FERRITIN: Ferritin: 22 ng/mL (ref 11–307)

## 2022-03-23 NOTE — Assessment & Plan Note (Addendum)
Likely unprovoked event. It was unclear whether the groundglass opacities seen on CT scan were due to pneumonia, although she was covered empirically with azithromycin.  She did not have much of upper respiratory infection symptoms at that time. Recommend long-term anticoagulation.  Consider finish 3 to 6 months of anticoagulation therapeutic Eliquis 5 mg twice daily followed by prophylactic dose of Eliquis.

## 2022-03-23 NOTE — Assessment & Plan Note (Signed)
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 Check iron, TIBC ferritin, hemoglobinopathy evaluation.

## 2022-03-23 NOTE — Progress Notes (Signed)
Hematology/Oncology Consult note [phone] [phone]         Patient Care Team: Wilfrid LundBecker, Anna G, PA as PCP - General (Family Medicine)  REFERRING PROVIDER: Raechel Chutegayli, Khabib, MD   CHIEF COMPLAINTS/REASON FOR VISIT:  Evaluation of pulmonary embolism  HISTORY OF PRESENTING ILLNESS:   Lisa Bray is a  31 y.o.  female with PMH listed below was seen in consultation at the request of  Raechel Chutegayli, Khabib, MD  for evaluation of pulmonary embolism  03/01/2022, patient felt left upper back discomfort to feel like pulled muscle.  She went to emergency room for further evaluation.  Denies any cough, fever or chills. Chest x-ray showed airspace opacity in the lingula may reflect infiltrate. CT angio chest PE protocol showed small pulmonary emboli within the lingula, associated groundglass of airspace opacity in the lingula.  Small left pleural effusion.  03/02/2022, bilateral lower extremity ultrasound negative for DVT.  Patient was started on blood thinner with Eliquis and empiric azithromycin and discharged home.  She went back to emergency room on 03/07/2022, for evaluation of recurrent pain which was significant as severe but from left collarbone down to her chest.  03/07/2022 repeat CT chest angiogram showed tiny nonocclusive pulmonary embolism in a lingular branch of the left pulmonary artery. Probable additional peripheral embolus in the lingula, similar to prior. No new or progressive pulmonary embolic disease evident.  Interval evolution of the lingular infarct, now with more consolidative appearance although size is similar to previous. Interval progression of left pleural effusion, now moderate in size.  Patient was seen by pulmonology for left pleural effusion, patient had point of focus ultrasound done which was reassuring the left pleural effusion mostly resolved.  Only trace left.  Patient had negative antiphospholipid antibodies, ANA was positive-SSA antibody  positive.  She has been referred to both hematology and rheumatology for further evaluation.  Today patient reports feeling well.  Denies any pain.  She occasionally feels dizzy.  Menstrual period is heavy. Patient denies previous personal history of thrombosis or family history of thrombosis.  Denies any family history of cancer.  Denies any OCP use, recent immobilization events prior to diagnosis of PE.    MEDICAL HISTORY:  Past Medical History:  Diagnosis Date   Neurogenic syncope    Recurrent upper respiratory infection (URI)     SURGICAL HISTORY: Past Surgical History:  Procedure Laterality Date   BREAST BIOPSY Right     SOCIAL HISTORY: Social History   Socioeconomic History   Marital status: Single    Spouse name: Not on file   Number of children: 0   Years of education: BS   Highest education level: Not on file  Occupational History   Occupation: [employer]  Tobacco Use   Smoking status: Never   Smokeless tobacco: Never  Vaping Use   Vaping Use: Never used  Substance and Sexual Activity   Alcohol use: No   Drug use: No   Sexual activity: Not on file  Other Topics Concern   Not on file  Social History Narrative   Lives at home, single   Right-handed   No caffeine   Social Determinants of Health   Financial Resource Strain: Not on file  Food Insecurity: Not on file  Transportation Needs: Not on file  Physical Activity: Not on file  Stress: Not on file  Social Connections: Not on file  Intimate Partner Violence: Not on file    FAMILY HISTORY: Family History  Problem Relation Age of Onset  Hypertension Father    Glaucoma Father    Migraines Neg Hx     ALLERGIES:  has No Known Allergies.  MEDICATIONS:  Current Outpatient Medications  Medication Sig Dispense Refill   apixaban (ELIQUIS) 5 MG TABS tablet Take 5 mg by mouth 2 (two) times daily.     benzoyl peroxide 5 % gel Apply topically 2 (two) times daily.     hydroquinone 4 % cream Apply  topically at bedtime.     Salicylic Acid (DERMACINRX ATRIX ANTIBAC WASH) 2 % LIQD Lather this product on face and wash off thoroughly every morning and evening.     tretinoin (RETIN-A) 0.025 % cream Apply topically at bedtime.     No current facility-administered medications for this visit.    Review of Systems  Constitutional:  Positive for fatigue. Negative for appetite change, chills and fever.  HENT:   Negative for hearing loss and voice change.   Eyes:  Negative for eye problems.  Respiratory:  Negative for chest tightness and cough.   Cardiovascular:  Negative for chest pain.  Gastrointestinal:  Negative for abdominal distention, abdominal pain and blood in stool.  Endocrine: Negative for hot flashes.  Genitourinary:  Negative for difficulty urinating and frequency.   Musculoskeletal:  Negative for arthralgias.  Skin:  Negative for itching and rash.  Neurological:  Positive for light-headedness. Negative for extremity weakness.  Hematological:  Negative for adenopathy.  Psychiatric/Behavioral:  Negative for confusion.    PHYSICAL EXAMINATION: ECOG PERFORMANCE STATUS: 0 - Asymptomatic Vitals:   03/23/22 0918  BP: 120/83  Pulse: 89  Resp: 16  Temp: 98.1 F (36.7 C)  SpO2: 100%   Filed Weights   03/23/22 0918  Weight: 188 lb 12.8 oz (85.6 kg)    Physical Exam Constitutional:      General: She is not in acute distress. HENT:     Head: Normocephalic and atraumatic.  Eyes:     General: No scleral icterus. Cardiovascular:     Rate and Rhythm: Normal rate and regular rhythm.     Heart sounds: Normal heart sounds.  Pulmonary:     Effort: Pulmonary effort is normal. No respiratory distress.     Breath sounds: No wheezing.  Abdominal:     General: Bowel sounds are normal. There is no distension.     Palpations: Abdomen is soft.  Musculoskeletal:        General: No deformity. Normal range of motion.     Cervical back: Normal range of motion and neck supple.  Skin:     General: Skin is warm and dry.     Findings: No erythema or rash.  Neurological:     Mental Status: She is alert and oriented to person, place, and time. Mental status is at baseline.     Cranial Nerves: No cranial nerve deficit.     Coordination: Coordination normal.  Psychiatric:        Mood and Affect: Mood normal.     LABORATORY DATA:  I have reviewed the data as listed    Latest Ref Rng & Units 03/23/2022   10:20 AM 03/14/2022   11:28 PM 03/07/2022    7:38 AM  CBC  WBC 4.0 - 10.5 K/uL 5.1  8.7  7.5   Hemoglobin 12.0 - 15.0 g/dL 96.0  45.4  09.8   Hematocrit 36.0 - 46.0 % 33.5  35.3  35.1   Platelets 150 - 400 K/uL 233  304  299  Latest Ref Rng & Units 03/23/2022   10:20 AM 03/14/2022   11:28 PM 03/07/2022    7:38 AM  CMP  Glucose 70 - 99 mg/dL  97  95   BUN 6 - 20 mg/dL  12  11   Creatinine 4.03 - 1.00 mg/dL  4.74  2.59   Sodium 563 - 145 mmol/L  139  137   Potassium 3.5 - 5.1 mmol/L  3.8  3.8   Chloride 98 - 111 mmol/L  105  103   CO2 22 - 32 mmol/L  26  24   Calcium 8.9 - 10.3 mg/dL  9.4  9.4   Total Protein 6.5 - 8.1 g/dL 8.1   8.2   Total Bilirubin 0.3 - 1.2 mg/dL 0.6   0.5   Alkaline Phos 38 - 126 U/L 83   74   AST 15 - 41 U/L 22   18   ALT 0 - 44 U/L 13   11       RADIOGRAPHIC STUDIES: I have personally reviewed the radiological images as listed and agreed with the findings in the report. DG Chest 2 View  Result Date: 03/09/2022 CLINICAL DATA:  31 year old female with shortness of breath EXAM: CHEST - 2 VIEW COMPARISON:  CT chest 03/07/2022, plain film 03/07/2022 FINDINGS: Cardiomediastinal silhouette unchanged in size and contour. Similar appearance of opacity at the left lung base partially obscuring the left hemidiaphragm and the left heart border. Meniscus in the sulcus on the lateral view. No pneumothorax. Right lung relatively well aerated. No displaced fracture IMPRESSION: Persisting opacity at the left lung base compatible with a combination  consolidation and left-sided pleural effusion, unchanged from the comparison Electronically Signed   By: Gilmer Mor D.O.   On: 03/09/2022 11:21   CT Angio Chest PE W and/or Wo Contrast  Result Date: 03/07/2022 CLINICAL DATA:  Known pulmonary embolus. EXAM: CT ANGIOGRAPHY CHEST WITH CONTRAST TECHNIQUE: Multidetector CT imaging of the chest was performed using the standard protocol during bolus administration of intravenous contrast. Multiplanar CT image reconstructions and MIPs were obtained to evaluate the vascular anatomy. RADIATION DOSE REDUCTION: This exam was performed according to the departmental dose-optimization program which includes automated exposure control, adjustment of the mA and/or kV according to patient size and/or use of iterative reconstruction technique. CONTRAST:  OMNIPAQUE IOHEXOL 350 MG/ML SOLN COMPARISON:  03/01/2022 FINDINGS: Cardiovascular: The heart size is normal. No substantial pericardial effusion. No thoracic aortic aneurysm. Tiny nonocclusive pulmonary embolus again identified in a lingular branch of the left pulmonary artery (see image 212/6). Probable additional peripheral embolus in the lingula, similar to prior. No evidence for right-sided pulmonary embolus. No new or progressive pulmonary embolic disease evident. Mediastinum/Nodes: No mediastinal lymphadenopathy. There is no hilar lymphadenopathy. The esophagus has normal imaging features. There is no axillary lymphadenopathy. Lungs/Pleura: Left pleural effusion is progressive in the interval now moderate in size. There has been evolution of the lingular infarct, now with more consolidative appearance although size is similar to previous study. Right lung remains clear. Upper Abdomen: Unremarkable. Musculoskeletal: No worrisome lytic or sclerotic osseous abnormality. Review of the MIP images confirms the above findings. IMPRESSION: 1. Tiny nonocclusive pulmonary embolus again identified in a lingular branch of the  left pulmonary artery. Probable additional peripheral embolus in the lingula, similar to prior. No new or progressive pulmonary embolic disease evident. 2. Interval evolution of the lingular infarct, now with more consolidative appearance although size is similar to previous. 3. Interval progression of left  pleural effusion, now moderate in size. Electronically Signed   By: Kennith Center M.D.   On: 03/07/2022 08:53   DG Chest Port 1 View  Result Date: 03/07/2022 CLINICAL DATA:  31 year old female with shortness of breath, bloody sputum, recent left lung pulmonary emboli. EXAM: PORTABLE CHEST 1 VIEW COMPARISON:  CTA chest 03/01/2022. FINDINGS: Portable AP upright view at 0756 hours. Progressed consolidation at the left lung base, probable small left pleural effusion now. No superimposed pneumothorax. Mediastinal contours remain stable, normal. Visualized tracheal air column is within normal limits. Right lung ventilation not significantly changed. Negative visible bowel gas. No acute osseous abnormality identified. 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 Electronically Signed   By: Odessa Fleming M.D.   On: 03/07/2022 08:36   US Venous Img Lower Bilateral  Result Date: 03/02/2022 CLINICAL DATA:  PE, rule out DVT EXAM: BILATERAL LOWER EXTREMITY VENOUS DOPPLER ULTRASOUND TECHNIQUE: Gray-scale sonography with compression, as well as color and duplex ultrasound, were performed to evaluate the deep venous system(s) from the level of the common femoral vein through the popliteal and proximal calf veins. COMPARISON:  None Available. FINDINGS: VENOUS Normal compressibility of the common femoral, superficial femoral, and popliteal veins, as well as the visualized calf veins. Visualized portions of profunda femoral vein and great saphenous vein unremarkable. No filling defects to suggest DVT on grayscale or color Doppler imaging.  Doppler waveforms show normal direction of venous flow, normal respiratory plasticity and response to augmentation. Limited views of the contralateral common femoral vein are unremarkable. OTHER Incidental note is made of a prominent left groin lymph node, which is within normal limits for size and morphology. Limitations: none IMPRESSION: Negative for DVT in the bilateral lower extremities. Electronically Signed   By: Wiliam Ke M.D.   On: 03/02/2022 00:07   CT Angio Chest PE W and/or Wo Contrast  Result Date: 03/01/2022 CLINICAL DATA:  Left side chest pain. Pulmonary embolism (PE) suspected, high prob EXAM: CT ANGIOGRAPHY CHEST WITH CONTRAST TECHNIQUE: Multidetector CT imaging of the chest was performed using the standard protocol during bolus administration of intravenous contrast. Multiplanar CT image reconstructions and MIPs were obtained to evaluate the vascular anatomy. RADIATION DOSE REDUCTION: This exam was performed according to the departmental dose-optimization program which includes automated exposure control, adjustment of the mA and/or kV according to patient size and/or use of iterative reconstruction technique. CONTRAST:  20mL OMNIPAQUE IOHEXOL 350 MG/ML SOLN COMPARISON:  None Available. FINDINGS: Cardiovascular: Heart is normal size. Aorta is normal caliber. Small filling defect within a lingular branch pulmonary artery compatible with small pulmonary embolus. Mediastinum/Nodes: No mediastinal, hilar, or axillary adenopathy. Trachea and esophagus are unremarkable. Thyroid unremarkable. Lungs/Pleura: Trace left pleural effusion. Airspace opacity in the lingula concerning for pneumonia. Upper Abdomen: Trace left pleural effusion. Airspace opacity in the inferior lingula in the area of pulmonary embolus. This could reflect pulmonary infarct. Musculoskeletal: No acute bony abnormality. Review of the MIP images confirms the above findings. IMPRESSION: Small pulmonary emboli within the lingula.  Associated ground-glass airspace opacity in the lingula. Small left pleural effusion. Electronically Signed   By: Charlett Nose M.D.   On: 03/01/2022 22:40   DG Chest 2 View  Result Date: 03/01/2022 CLINICAL DATA:  Chest pain EXAM: CHEST - 2 VIEW COMPARISON:  None Available. FINDINGS: The heart size and mediastinal contours are within normal limits. Lingular airspace opacity. No pleural effusion. No pneumothorax. The visualized skeletal structures are unremarkable.  IMPRESSION: Airspace opacity in the lingula may reflect infiltrate. Electronically Signed   By: Dahlia Bailiff M.D.   On: 03/01/2022 20:30   US PELVIS (TRANSABDOMINAL ONLY)  Result Date: 01/30/2022 CLINICAL DATA:  Rule out ovarian mass. Left pelvic pain for 3 weeks. Urinary frequency. EXAM: TRANSABDOMINAL ULTRASOUND OF PELVIS TECHNIQUE: Transabdominal ultrasound examination of the pelvis was performed including evaluation of the uterus, ovaries, adnexal regions, and pelvic cul-de-sac. COMPARISON:  None Available. FINDINGS: Uterus Measurements: 9.0 x 3.7 x 3.8 cm = volume: 69 mL. No fibroids or other mass visualized. Endometrium Thickness: 2.8 mm. There is a mass in the endometrium measuring 1.4 x 1.1 x 1.2 cm Right ovary Measurements: 3.5 x 1.9 x 4.6 cm = volume: 16 mL. Normal appearance/no adnexal mass. Left ovary Measurements: 2.5 x 2.6 x 3.1 cm = volume: 10 mL. Normal appearance/no adnexal mass. Other findings:  No abnormal free fluid. IMPRESSION: 1. There is a 1.4 cm endometrial mass. Recommend gynecologic consultation with history of sonography and tissue sampling. 2. No other abnormalities. These results will be called to the ordering clinician or representative by the Radiologist Assistant, and communication documented in the PACS or [employer]. Electronically Signed   By: Dorise Bullion III M.D.   On: 01/30/2022 11:34       ASSESSMENT & PLAN:   Pulmonary embolus ([place]) Likely unprovoked event. It was unclear whether the  groundglass opacities seen on CT scan were due to pneumonia, although she was covered empirically with azithromycin.  She did not have much of upper respiratory infection symptoms at that time. Recommend long-term anticoagulation.  Consider finish 3 to 6 months of anticoagulation therapeutic Eliquis followed by prophylactic dose of Eliquis.  Microcytosis 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 Check iron, TIBC ferritin, hemoglobinopathy evaluation.   Orders Placed This Encounter  Procedures   CBC with Differential/Platelet    Standing Status:   Future    Number of Occurrences:   1    Standing Expiration Date:   03/24/2023   Retic Panel    Standing Status:   Future    Number of Occurrences:   1    Standing Expiration Date:   03/24/2023   Ferritin    Standing Status:   Future    Number of Occurrences:   1    Standing Expiration Date:   09/22/2022   Iron and TIBC    Standing Status:   Future    Number of Occurrences:   1    Standing Expiration Date:   03/24/2023   Hgb Fractionation Cascade    Standing Status:   Future    Number of Occurrences:   1    Standing Expiration Date:   03/24/2023   Hepatic function panel    Standing Status:   Future    Number of Occurrences:   1    Standing Expiration Date:   03/23/2023   Follow-up 3 months All questions were answered. The patient knows to call the clinic with any problems, questions or concerns.  Armando Reichert, MD    Thank you for this kind referral and the opportunity to participate in the care of this patient. A copy of today's note is routed to referring provider   Earlie Server, MD, PhD [facility] Hematology Oncology 03/23/2022

## 2022-03-25 LAB — HGB FRACTIONATION BY HPLC
Hgb A2: 3.1 % (ref 1.8–3.2)
Hgb A: 58 % — ABNORMAL LOW (ref 96.4–98.8)
Hgb C: 37.4 % — ABNORMAL HIGH
Hgb E: 0 %
Hgb F: 1.5 % (ref 0.0–2.0)
Hgb S: 0 %
Hgb Variant: 0 %

## 2022-03-25 LAB — HGB FRACTIONATION CASCADE

## 2022-04-20 ENCOUNTER — Ambulatory Visit: Payer: BC Managed Care – PPO | Admitting: Family Medicine

## 2022-04-20 VITALS — BP 102/82 | HR 91 | Temp 98.7°F | Ht 66.0 in | Wt 187.8 lb

## 2022-04-20 DIAGNOSIS — Z7689 Persons encountering health services in other specified circumstances: Secondary | ICD-10-CM | POA: Diagnosis not present

## 2022-04-20 DIAGNOSIS — Z7901 Long term (current) use of anticoagulants: Secondary | ICD-10-CM

## 2022-04-20 DIAGNOSIS — R42 Dizziness and giddiness: Secondary | ICD-10-CM | POA: Diagnosis not present

## 2022-04-20 DIAGNOSIS — Z86711 Personal history of pulmonary embolism: Secondary | ICD-10-CM | POA: Diagnosis not present

## 2022-04-20 NOTE — Progress Notes (Signed)
Patient presents to clinic today to establish care and follow-up on ongoing concerns..  SUBJECTIVE: PMH: Patient is a 31 year old female.  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  Causing lightheadedness.  Does not want to be on anticoagulation -drinking 50-80 oz of water per day  H/o syncopal episode: -in 2011 had syncopal event -told to exercise and drink more water -possibly due to bp issues -saw Cards.  "May have had tilt test.  Adrenaline pills"   Past Surgical Hx: -R breast cyst removed  Allergies: NKDA  Social history: Patient is a IT trainer.  She is currently single.  Patient denies alcohol, tobacco, drug use.  Health Maintenance: Dental --Tammy Artist Vision --Marcille Buffy Pulmonology--Khabib Dgayi Immunizations -- Colonoscopy -- Mammogram -- PAP --2019 LMP--04/06/2022 Bone Density --   Family Hx: Mom, AAW Dad, alive- HTN, glaucoma MGM-MI Dad had 6 kids   Past Medical History:  Diagnosis Date   Neurogenic syncope    Recurrent upper respiratory infection (URI)     Past Surgical History:  Procedure Laterality Date   BREAST BIOPSY Right     Current Outpatient Medications on File Prior to Visit  Medication Sig Dispense Refill   apixaban (ELIQUIS) 5 MG TABS tablet Take 5 mg by mouth 2 (two) times daily.     benzoyl peroxide 5 % gel Apply topically 2 (two) times daily.     hydroquinone 4 % cream Apply topically at bedtime.     Salicylic Acid (DERMACINRX ATRIX ANTIBAC WASH) 2 % LIQD Lather this product on face and wash off thoroughly every morning and evening.     tretinoin (RETIN-A) 0.025 % cream Apply topically at bedtime.     No current facility-administered medications on file prior to visit.    No Known Allergies  Family History  Problem Relation Age of Onset   Hypertension Father    Glaucoma Father    Migraines Neg Hx      Social History   Socioeconomic History   Marital status: Single    Spouse name: Not on file   Number of children: 0   Years of education: BS   Highest education level: Not on file  Occupational History   Occupation: [employer]  Tobacco Use   Smoking status: Never   Smokeless tobacco: Never  Vaping Use   Vaping Use: Never used  Substance and Sexual Activity   Alcohol use: No   Drug use: No   Sexual activity: Not on file  Other Topics Concern   Not on file  Social History Narrative   Lives at home, single   Right-handed   No caffeine   Social Determinants of Health   Financial Resource Strain: Not on file  Food Insecurity: Not on file  Transportation Needs: Not on file  Physical Activity: Not on file  Stress: Not on file  Social Connections: Not on file  Intimate Partner Violence: Not on file    ROS General: Denies fever, chills, night sweats, changes in weight, changes in appetite HEENT: Denies headaches, ear pain, changes in vision, rhinorrhea, sore throat CV: Denies CP, palpitations, SOB, orthopnea Pulm: Denies SOB, cough, wheezing GI: Denies abdominal pain, nausea, vomiting, diarrhea, constipation GU: Denies dysuria, hematuria, frequency, vaginal discharge Msk: Denies muscle cramps, joint pains Neuro: Denies weakness, numbness, tingling Skin: Denies rashes, bruising Psych: Denies depression, anxiety, hallucinations   BP 102/82 (BP Location: Left Arm,  Patient Position: Sitting, Cuff Size: Normal)   Pulse 91   Temp 98.7 F (37.1 C) (Oral)   Ht 5\' 6"  (1.676 m)   Wt 187 lb 12.8 oz (85.2 kg)   LMP 04/06/2022 (Exact Date)   SpO2 95%   BMI 30.31 kg/m   Physical Exam Gen. Pleasant, well developed, well-nourished, in NAD HEENT - [place]/AT, PERRL, EOMI, conjunctive clear, no scleral icterus, no nasal drainage Lungs: no use of accessory muscles, CTAB, no wheezes, rales or rhonchi Cardiovascular: RRR,  No r/g/m, no peripheral edema Musculoskeletal: No  deformities, moves all four extremities, no cyanosis or clubbing, normal tone Neuro:  A&Ox3, CN II-XII intact, normal gait Skin:  Warm, dry, intact, no lesions  Recent Results (from the past 2160 hour(s))  Basic metabolic panel     Status: Abnormal   Collection Time: 03/01/22  7:57 PM  Result Value Ref Range   Sodium 139 135 - 145 mmol/L   Potassium 3.6 3.5 - 5.1 mmol/L   Chloride 104 98 - 111 mmol/L   CO2 27 22 - 32 mmol/L   Glucose, Bld 102 (H) 70 - 99 mg/dL    Comment: Glucose reference range applies only to samples taken after fasting for at least 8 hours.   BUN 10 6 - 20 mg/dL   Creatinine, Ser 4.090.70 0.44 - 1.00 mg/dL   Calcium 8.9 8.9 - 81.110.3 mg/dL   GFR, Estimated >91>60 >47>60 mL/min    Comment: (NOTE) Calculated using the CKD-EPI Creatinine Equation (2021)    Anion gap 8 5 - 15    Comment: Performed at [facility], [address] [place], [place] [address]  CBC     Status: Abnormal   Collection Time: 03/01/22  7:57 PM  Result Value Ref Range   WBC 10.6 (H) 4.0 - 10.5 K/uL   RBC 4.39 3.87 - 5.11 MIL/uL   Hemoglobin 11.2 (L) 12.0 - 15.0 g/dL   HCT 21.332.4 (L) 08.636.0 - 57.846.0 %   MCV 73.8 (L) 80.0 - 100.0 fL   MCH 25.5 (L) 26.0 - 34.0 pg   MCHC 34.6 30.0 - 36.0 g/dL   RDW 46.916.3 (H) 62.911.5 - 52.815.5 %   Platelets 215 150 - 400 K/uL   nRBC 0.0 0.0 - 0.2 %    Comment: Performed at [facility], [address] [place], [place] [address]  Troponin I (High Sensitivity)     Status: None   Collection Time: 03/01/22  7:57 PM  Result Value Ref Range   Troponin I (High Sensitivity) <2 <18 ng/L    Comment: (NOTE) Elevated high sensitivity troponin I (hsTnI) values and significant  changes across serial measurements may suggest ACS but many other  chronic and acute conditions are known to elevate hsTnI results.  Refer to the "Links" section for chest pain algorithms and additional  guidance. Performed at [facility], [address] [place], [place] [address]    Troponin I (High Sensitivity)     Status: None   Collection Time: 03/01/22  9:53 PM  Result Value Ref Range   Troponin I (High Sensitivity) <2 <18 ng/L    Comment: (NOTE) Elevated high sensitivity troponin I (hsTnI) values and significant  changes across serial measurements may suggest ACS but many other  chronic and acute conditions are known to elevate hsTnI results.  Refer to the "Links" section for chest pain algorithms and additional  guidance. Performed at [facility], [address] [place], [place] [address]  Resp Panel by RT-PCR (Flu A&B, Covid) Anterior Nasal Swab     Status: None   Collection Time: 03/01/22  9:53 PM   Specimen: Anterior Nasal Swab  Result Value Ref Range   SARS Coronavirus 2 by RT PCR NEGATIVE NEGATIVE    Comment: (NOTE) SARS-CoV-2 target nucleic acids are NOT DETECTED.  The SARS-CoV-2 RNA is generally detectable in upper respiratory specimens during the acute phase of infection. The lowest concentration of SARS-CoV-2 viral copies this assay can detect is 138 copies/mL. A negative result does not preclude SARS-Cov-2 infection and should not be used as the sole basis for treatment or other patient management decisions. A negative result may occur with  improper specimen collection/handling, submission of specimen other than nasopharyngeal swab, presence of viral mutation(s) within the areas targeted by this assay, and inadequate number of viral copies(<138 copies/mL). A negative result must be combined with clinical observations, patient history, and epidemiological information. The expected result is Negative.  Fact Sheet for Patients:  BloggerCourse.com  Fact Sheet for Healthcare Providers:  SeriousBroker.it  This test is no t yet approved or cleared by the [place] FDA and  has been authorized for detection and/or diagnosis of SARS-CoV-2 by FDA under an Emergency Use  Authorization (EUA). This EUA will remain  in effect (meaning this test can be used) for the duration of the COVID-19 declaration under Section 564(b)(1) of the Act, 21 U.S.C.section 360bbb-3(b)(1), unless the authorization is terminated  or revoked sooner.       Influenza A by PCR NEGATIVE NEGATIVE   Influenza B by PCR NEGATIVE NEGATIVE    Comment: (NOTE) The Xpert Xpress SARS-CoV-2/FLU/RSV plus assay is intended as an aid in the diagnosis of influenza from Nasopharyngeal swab specimens and should not be used as a sole basis for treatment. Nasal washings and aspirates are unacceptable for Xpert Xpress SARS-CoV-2/FLU/RSV testing.  Fact Sheet for Patients: BloggerCourse.com  Fact Sheet for Healthcare Providers: SeriousBroker.it  This test is not yet approved or cleared by the [place] FDA and has been authorized for detection and/or diagnosis of SARS-CoV-2 by FDA under an Emergency Use Authorization (EUA). This EUA will remain in effect (meaning this test can be used) for the duration of the COVID-19 declaration under Section 564(b)(1) of the Act, 21 U.S.C. section 360bbb-3(b)(1), unless the authorization is terminated or revoked.  Performed at [facility], [address] [place], [place] [address]   POC urine preg, ED     Status: None   Collection Time: 03/01/22 10:04 PM  Result Value Ref Range   Preg Test, Ur Negative Negative  Comprehensive metabolic panel     Status: Abnormal   Collection Time: 03/07/22  7:38 AM  Result Value Ref Range   Sodium 137 135 - 145 mmol/L   Potassium 3.8 3.5 - 5.1 mmol/L   Chloride 103 98 - 111 mmol/L   CO2 24 22 - 32 mmol/L   Glucose, Bld 95 70 - 99 mg/dL    Comment: Glucose reference range applies only to samples taken after fasting for at least 8 hours.   BUN 11 6 - 20 mg/dL   Creatinine, Ser 7.94 0.44 - 1.00 mg/dL   Calcium 9.4 8.9 - 80.1 mg/dL   Total Protein 8.2 (H)  6.5 - 8.1 g/dL   Albumin 3.6 3.5 - 5.0 g/dL   AST 18 15 - 41 U/L   ALT 11 0 - 44 U/L   Alkaline Phosphatase 74 38 - 126 U/L  Total Bilirubin 0.5 0.3 - 1.2 mg/dL   GFR, Estimated >60 >60 mL/min    Comment: (NOTE) Calculated using the CKD-EPI Creatinine Equation (2021)    Anion gap 10 5 - 15    Comment: Performed at [facility], [address] [place], [place] [address]  CBC     Status: Abnormal   Collection Time: 03/07/22  7:38 AM  Result Value Ref Range   WBC 7.5 4.0 - 10.5 K/uL   RBC 4.79 3.87 - 5.11 MIL/uL   Hemoglobin 11.9 (L) 12.0 - 15.0 g/dL   HCT 35.1 (L) 36.0 - 46.0 %   MCV 73.3 (L) 80.0 - 100.0 fL   MCH 24.8 (L) 26.0 - 34.0 pg   MCHC 33.9 30.0 - 36.0 g/dL   RDW 15.9 (H) 11.5 - 15.5 %   Platelets 299 150 - 400 K/uL   nRBC 0.0 0.0 - 0.2 %    Comment: Performed at [facility], [address] [place], [place] [address]  POC urine preg, ED     Status: None   Collection Time: 03/07/22  9:07 AM  Result Value Ref Range   Preg Test, Ur Negative Negative  ANA Comprehensive Panel     Status: Abnormal   Collection Time: 03/07/22  9:48 AM  Result Value Ref Range   ds DNA Ab 1 0 - 9 IU/mL    Comment: (NOTE)                                   Negative      <5                                   Equivocal  5 - 9                                   Positive      >9    Ribonucleic Protein <0.2 0.0 - 0.9 AI   ENA SM Ab Ser-aCnc <0.2 0.0 - 0.9 AI   Scleroderma (Scl-70) (ENA) Antibody, IgG <0.2 0.0 - 0.9 AI   SSA (Ro) (ENA) Antibody, IgG 5.5 (H) 0.0 - 0.9 AI   SSB (La) (ENA) Antibody, IgG <0.2 0.0 - 0.9 AI   Chromatin Ab SerPl-aCnc <0.2 0.0 - 0.9 AI   Anti JO-1 <0.2 0.0 - 0.9 AI   Centromere Ab Screen <0.2 0.0 - 0.9 AI   See below: Comment     Comment: (NOTE) Autoantibody                       Disease Association ------------------------------------------------------------                        Condition                  Frequency ---------------------    ------------------------   --------- Antinuclear Antibody,    SLE, mixed connective Direct (ANA-D)           tissue diseases ---------------------   ------------------------   --------- dsDNA                    SLE  40 - 60% ---------------------   ------------------------   --------- Chromatin                Drug induced SLE                90%                         SLE                        48 - 97% ---------------------   ------------------------   --------- SSA (Ro)                 SLE                        25 - 35%                         Sjogren's Syndrome         40 - 70%                         Neonatal Lupus                 100% ---------------------   ------------------------   --------- SSB (La)                 SLE                              10%                         Sjogren's Syndrome              30% ---------------------   -----------------------    --------- Sm (anti-Smith)          SLE                        15 - 30% ---------------------   -----------------------    --------- RNP                      Mixed Connective Tissue                         Disease                         95% (U1 nRNP,                SLE                        30 - 50% anti-ribonucleoprotein)  Polymyositis and/or                         Dermatomyositis                 20% ---------------------   ------------------------   --------- Scl-70 (antiDNA          Scleroderma (diffuse)      20 - 35% topoisomerase)           Crest  13% ---------------------   ------------------------   --------- Jo-1                     Polymyositis and/or                         Dermatomyositis            20 - 40% ---------------------   ------------------------   --------- Centromere B             Scleroderma -  Crest                         variant                         80% Performed At: [place], [place] [id number] Rush Farmer MD [id number]   ANTIPHOSPHOLIPID SYNDROME PROF     Status: None   Collection Time: 03/07/22  9:48 AM  Result Value Ref Range   Anticardiolipin IgG <9 0 - 14 GPL U/mL    Comment: (NOTE)                          Negative:              <15                          Indeterminate:     15 - 20                          Low-Med Positive: >20 - 80                          High Positive:         >80    Anticardiolipin IgM 11 0 - 12 MPL U/mL    Comment: (NOTE)                          Negative:              <13                          Indeterminate:     13 - 20                          Low-Med Positive: >20 - 80                          High Positive:         >80    PTT Lupus Anticoagulant 35.0 0.0 - 43.5 sec   DRVVT 44.6 0.0 - 47.0 sec   Lupus Anticoag Interp Comment:     Comment: (NOTE) No lupus anticoagulant was detected. Performed At: [facility] [place], [place] [id number] Rush Farmer MD [id number]   Basic metabolic panel     Status: None   Collection Time: 03/14/22 11:28 PM  Result Value Ref Range   Sodium 139 135 - 145 mmol/L   Potassium 3.8 3.5 - 5.1 mmol/L   Chloride 105 98 - 111 mmol/L   CO2 26 22 - 32 mmol/L  Glucose, Bld 97 70 - 99 mg/dL    Comment: Glucose reference range applies only to samples taken after fasting for at least 8 hours.   BUN 12 6 - 20 mg/dL   Creatinine, Ser 0.84 0.44 - 1.00 mg/dL   Calcium 9.4 8.9 - 10.3 mg/dL   GFR, Estimated >60 >60 mL/min    Comment: (NOTE) Calculated using the CKD-EPI Creatinine Equation (2021)    Anion gap 8 5 - 15    Comment: Performed at [facility], [address] [place], [place] [address]  CBC     Status: Abnormal   Collection Time: 03/14/22 11:28 PM  Result Value Ref Range   WBC 8.7 4.0 - 10.5 K/uL   RBC 4.82 3.87 - 5.11 MIL/uL   Hemoglobin 12.0 12.0 - 15.0 g/dL   HCT 35.3 (L) 36.0 - 46.0 %   MCV 73.2 (L) 80.0 - 100.0 fL   MCH 24.9 (L) 26.0 - 34.0 pg   MCHC  34.0 30.0 - 36.0 g/dL   RDW 16.1 (H) 11.5 - 15.5 %   Platelets 304 150 - 400 K/uL   nRBC 0.0 0.0 - 0.2 %    Comment: Performed at [facility], [place], [place] [address]  Troponin I (High Sensitivity)     Status: None   Collection Time: 03/14/22 11:28 PM  Result Value Ref Range   Troponin I (High Sensitivity) <2 <18 ng/L    Comment: (NOTE) Elevated high sensitivity troponin I (hsTnI) values and significant  changes across serial measurements may suggest ACS but many other  chronic and acute conditions are known to elevate hsTnI results.  Refer to the "Links" section for chest pain algorithms and additional  guidance. Performed at [facility], [place], [place] [address]   Troponin I (High Sensitivity)     Status: None   Collection Time: 03/15/22  2:31 AM  Result Value Ref Range   Troponin I (High Sensitivity) <2 <18 ng/L    Comment: (NOTE) Elevated high sensitivity troponin I (hsTnI) values and significant  changes across serial measurements may suggest ACS but many other  chronic and acute conditions are known to elevate hsTnI results.  Refer to the "Links" section for chest pain algorithms and additional  guidance. Performed at [facility], [address] [place], [place] [address]   Urinalysis, Routine w reflex microscopic     Status: Abnormal   Collection Time: 03/15/22  3:01 AM  Result Value Ref Range   Color, Urine YELLOW YELLOW   APPearance CLEAR CLEAR   Specific Gravity, Urine 1.015 1.005 - 1.030   pH 7.0 5.0 - 8.0   Glucose, UA NEGATIVE NEGATIVE mg/dL   Hgb urine dipstick MODERATE (A) NEGATIVE   Bilirubin Urine NEGATIVE NEGATIVE   Ketones, ur 15 (A) NEGATIVE mg/dL   Protein, ur NEGATIVE NEGATIVE mg/dL   Nitrite NEGATIVE NEGATIVE   Leukocytes,Ua NEGATIVE NEGATIVE    Comment: Performed at [facility], [address] [place], [place] [address]  Urinalysis, Microscopic (reflex)     Status:  Abnormal   Collection Time: 03/15/22  3:01 AM  Result Value Ref Range   RBC / HPF 0-5 0 - 5 RBC/hpf   WBC, UA 0-5 0 - 5 WBC/hpf   Bacteria, UA RARE (A) NONE SEEN   Squamous Epithelial / LPF 0-5 0 - 5   Mucus PRESENT     Comment: Performed at [facility], [place],  [place], [place] [address]  Hepatic function panel     Status: None   Collection Time: 03/23/22 10:20 AM  Result Value Ref Range   Total Protein 8.1 6.5 - 8.1 g/dL   Albumin 4.0 3.5 - 5.0 g/dL   AST 22 15 - 41 U/L   ALT 13 0 - 44 U/L   Alkaline Phosphatase 83 38 - 126 U/L   Total Bilirubin 0.6 0.3 - 1.2 mg/dL   Bilirubin, Direct <0.1 0.0 - 0.2 mg/dL   Indirect Bilirubin NOT CALCULATED 0.3 - 0.9 mg/dL    Comment: Performed at [facility], [address] [place], [place] [address]  Hgb Fractionation [place]     Status: None   Collection Time: 03/23/22 10:20 AM  Result Value Ref Range   Hgb F Reflexed 0.0 - 2.0 %   Hgb A Reflexed 96.4 - 98.8 %   Hgb A2 Reflexed 1.8 - 3.2 %   Hgb S Reflexed 0.0 %   Interpretation, Hgb Fract Comment     Comment: (NOTE) Capillary Electrophoresis (CE) performed. Reflex to High-pressure liquid chromatography (HPLC) method for confirmation. Performed At: [facility] [place], [place] [id number] Rush Farmer MD [id number]   Iron and TIBC     Status: None   Collection Time: 03/23/22 10:20 AM  Result Value Ref Range   Iron 64 28 - 170 ug/dL   TIBC 367 250 - 450 ug/dL   Saturation Ratios 17 10.4 - 31.8 %   UIBC 303 ug/dL    Comment: Performed at [facility], [address] [place], [place] [address]  Ferritin     Status: None   Collection Time: 03/23/22 10:20 AM  Result Value Ref Range   Ferritin 22 11 - 307 ng/mL    Comment: Performed at [facility], [address] [place], [place] [address]  Retic Panel     Status: None   Collection Time: 03/23/22 10:20 AM  Result Value Ref Range   Retic Ct Pct 0.9 0.4 -  3.1 %   RBC. 4.61 3.87 - 5.11 MIL/uL   Retic Count, Absolute 40.6 19.0 - 186.0 K/uL   Immature Retic Fract 6.8 2.3 - 15.9 %   Reticulocyte Hemoglobin 31.1 >27.9 pg    Comment: Performed at [facility], [place], [place] [address]  CBC with Differential/Platelet     Status: Abnormal   Collection Time: 03/23/22 10:20 AM  Result Value Ref Range   WBC 5.1 4.0 - 10.5 K/uL   RBC 4.61 3.87 - 5.11 MIL/uL   Hemoglobin 11.8 (L) 12.0 - 15.0 g/dL   HCT 33.5 (L) 36.0 - 46.0 %   MCV 72.7 (L) 80.0 - 100.0 fL   MCH 25.6 (L) 26.0 - 34.0 pg   MCHC 35.2 30.0 - 36.0 g/dL   RDW 16.1 (H) 11.5 - 15.5 %   Platelets 233 150 - 400 K/uL   nRBC 0.0 0.0 - 0.2 %   Neutrophils Relative % 59 %   Neutro Abs 3.0 1.7 - 7.7 K/uL   Lymphocytes Relative 32 %   Lymphs Abs 1.6 0.7 - 4.0 K/uL   Monocytes Relative 8 %   Monocytes Absolute 0.4 0.1 - 1.0 K/uL   Eosinophils Relative 1 %   Eosinophils Absolute 0.1 0.0 - 0.5 K/uL   Basophils Relative 0 %   Basophils Absolute 0.0 0.0 - 0.1 K/uL   Immature Granulocytes 0 %   Abs Immature Granulocytes 0.02 0.00 -  0.07 K/uL    Comment: Performed at [facility], [address] [place], [place] [address]  Hgb Fractionation by HPLC     Status: Abnormal   Collection Time: 03/23/22 10:20 AM  Result Value Ref Range   Hgb F 1.5 0.0 - 2.0 %   Hgb A 58.0 (L) 96.4 - 98.8 %   Hgb A2 3.1 1.8 - 3.2 %   Hgb S 0.0 0.0 %   Hgb C 37.4 (H) 0.0 %   Hgb E 0.0 0.0 %   Hgb Variant 0.0 0.0 %   Final Interpretation: Comment     Comment: (NOTE) 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 Suggest clinical and hematologic correlation.            Hgb C-Trait Interpretation Ranges            Hgb A      50.0 - 70.0%            Hgb C      30.0 - 45.0%            Hgb A2      2.0 -  4.5% Performed At: [facility] [place] [address] [place], [place] [id number] Rush Farmer MD [id number]      Assessment/Plan: History of pulmonary embolus (PE) -Unprovoked PE x2 02/2022 -Continue Eliquis -Continue follow-up with pulm and oncology. -Encouraged to keep upcoming appointment with rheumatology  On anticoagulant therapy -continue Eliquis  Lightheaded -Possibly 2/2 medication  Encounter to establish care -We reviewed the PMH, PSH, FH, SH, Meds and Allergies. -We provided refills for any medications we will prescribe as needed. -We addressed current concerns per orders and patient instructions. -We have asked for records for pertinent exams, studies, vaccines and notes from previous providers. -We have advised patient to follow up per instructions below.  F/u prn  Grier Mitts, MD

## 2022-04-30 ENCOUNTER — Encounter: Payer: Self-pay | Admitting: Family Medicine

## 2022-06-11 ENCOUNTER — Ambulatory Visit: Payer: BC Managed Care – PPO | Admitting: Family Medicine

## 2022-06-23 ENCOUNTER — Inpatient Hospital Stay: Payer: BC Managed Care – PPO | Attending: Oncology

## 2022-06-23 DIAGNOSIS — I2699 Other pulmonary embolism without acute cor pulmonale: Secondary | ICD-10-CM | POA: Insufficient documentation

## 2022-06-23 DIAGNOSIS — J90 Pleural effusion, not elsewhere classified: Secondary | ICD-10-CM | POA: Diagnosis not present

## 2022-06-23 DIAGNOSIS — Z7901 Long term (current) use of anticoagulants: Secondary | ICD-10-CM | POA: Diagnosis not present

## 2022-06-23 DIAGNOSIS — R718 Other abnormality of red blood cells: Secondary | ICD-10-CM

## 2022-06-24 LAB — PROTEIN C ACTIVITY: Protein C Activity: 110 % (ref 73–180)

## 2022-06-24 LAB — PROTEIN S, TOTAL AND FREE
Protein S Ag, Free: 76 % (ref 61–136)
Protein S Ag, Total: 103 % (ref 60–150)

## 2022-06-25 LAB — BETA-2-GLYCOPROTEIN I ABS, IGG/M/A
Beta-2 Glyco I IgG: <9 GPI IgG units (ref 0–20)
Beta-2-Glycoprotein I IgA: <9 GPI IgA units (ref 0–25)
Beta-2-Glycoprotein I IgM: <9 GPI IgM units (ref 0–32)

## 2022-06-29 LAB — FACTOR 5 LEIDEN

## 2022-06-30 ENCOUNTER — Inpatient Hospital Stay: Payer: BC Managed Care – PPO | Admitting: Oncology

## 2022-06-30 ENCOUNTER — Encounter: Payer: Self-pay | Admitting: Oncology

## 2022-06-30 VITALS — BP 124/78 | HR 81 | Temp 97.1°F | Wt 189.6 lb

## 2022-06-30 DIAGNOSIS — D582 Other hemoglobinopathies: Secondary | ICD-10-CM

## 2022-06-30 DIAGNOSIS — R768 Other specified abnormal immunological findings in serum: Secondary | ICD-10-CM | POA: Insufficient documentation

## 2022-06-30 DIAGNOSIS — I2699 Other pulmonary embolism without acute cor pulmonale: Secondary | ICD-10-CM

## 2022-06-30 MED ORDER — APIXABAN 2.5 MG PO TABS: 2.5000 mg | ORAL_TABLET | Freq: Two times a day (BID) | ORAL | 1 refills | Status: DC

## 2022-06-30 NOTE — Assessment & Plan Note (Signed)
Patient has been seen by rheumatologist.  Possible emerging connective tissue disease however not meeting indication of starting medication.  Continue follow-up with rheumatology

## 2022-06-30 NOTE — Progress Notes (Addendum)
Hematology/Oncology Consult note [phone] [phone]         Patient Care Team: Deeann Saint, MD as PCP - General (Family Medicine) CHIEF COMPLAINTS/REASON FOR VISIT:  Follow-up for pulmonary embolism  ASSESSMENT & PLAN:   Pulmonary embolus (HCC) unprovoked small pulmonary embolism Patient has finished 3+ months of Eliquis 5 mg twice daily.   Clinically she is doing very well, shortness of breath has resolved. Hypercoagulable workup is negative including negative antiphospholipid syndrome, negative factor V Leiden mutation, normal protein C/S levels, negative beta glycoprotein antibodies.  Prothrombin gene mutation is pending at the time of dictation. Recommend to decrease to Eliquis 2.5 mg twice daily for long-term anticoagulation prophylaxis. Patient expresses her desire of not wanting long-term Eliquis Shared decision was made to start Eliquis 2.5 mg twice daily for at least a year and then re discussed at that point. Patient is currently not sexually active, and there is plan of IUD in the near future.  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  ANA positive Patient has been seen by rheumatologist.  Possible emerging connective tissue disease however not meeting indication of starting medication.  Continue follow-up with rheumatology  Orders Placed This Encounter  Procedures   CBC with Differential/Platelet    Standing Status:   Future    Standing Expiration Date:   07/01/2023   Comprehensive metabolic panel    Standing Status:   Future    Standing Expiration Date:   06/30/2023   Iron and TIBC    Standing Status:   Future    Standing Expiration Date:   07/01/2023   Ferritin    Standing Status:   Future    Standing Expiration Date:   07/01/2023    All questions were answered. The patient knows to call the clinic with any problems,  questions or concerns.  Rickard Patience, MD, PhD [facility] Hematology Oncology 06/30/2022   HISTORY OF PRESENTING ILLNESS:   Lisa Bray is a  32 y.o.  female with PMH listed below was seen in consultation at the request of  Wilfrid Lund, [place]  for evaluation of pulmonary embolism  03/01/2022, patient felt left upper back discomfort to feel like pulled muscle.  She went to emergency room for further evaluation.  Denies any cough, fever or chills. Chest x-ray showed airspace opacity in the lingula may reflect infiltrate. CT angio chest PE protocol showed small pulmonary emboli within the lingula, associated groundglass of airspace opacity in the lingula.  Small left pleural effusion.  03/02/2022, bilateral lower extremity ultrasound negative for DVT.  Patient was started on blood thinner with Eliquis and empiric azithromycin and discharged home.  She went back to emergency room on 03/07/2022, for evaluation of recurrent pain which was significant as severe but from left collarbone down to her chest.  03/07/2022 repeat CT chest angiogram showed tiny nonocclusive pulmonary embolism in a lingular branch of the left pulmonary artery. Probable additional peripheral embolus in the lingula, similar to prior. No new or progressive pulmonary embolic disease evident.  Interval evolution of the lingular infarct, now with more consolidative appearance although size is similar to previous. Interval progression of left pleural effusion, now moderate in size.  Patient was seen by pulmonology for left pleural effusion, patient had point of focus ultrasound done which was reassuring the left pleural effusion mostly resolved.  Only trace left.  Patient had negative  antiphospholipid antibodies, ANA was positive-SSA antibody positive.  She has been referred to both hematology and rheumatology for further evaluation.  Patient denies previous personal history of thrombosis or family history of thrombosis.  Denies any  family history of cancer.  Denies any OCP use, recent immobilization events prior to diagnosis of PE.  INTERVAL HISTORY Lisa Bray is a 32 y.o. female who has above history reviewed by me today presents for follow up visit for history of acute unprovoked pulmonary embolism. Patient reports feeling well.  She takes Eliquis 5 mg twice daily Denies any bleeding events.  Menstrual period is heavy.  She has upcoming appointment with gynecology. Patient had positive ANA and was evaluated by rheumatology Dr. Allena Katz who feels that her clinical history is suspicious for emerging systemic connective tissue disease although currently she is asymptomatic, she has no symptoms for Sjogren syndrome lupus.  Dr. Allena Katz recommend long-term watchful waiting.-If she becomes pregnant in the future, Dr. Allena Katz recommend her to stop Plaquenil to reduce risk of neonatal lupus.  She was recommended to follow-up in 3 months with rheumatology.     MEDICAL HISTORY:  Past Medical History:  Diagnosis Date   Neurogenic syncope    Recurrent upper respiratory infection (URI)     SURGICAL HISTORY: Past Surgical History:  Procedure Laterality Date   BREAST BIOPSY Right     SOCIAL HISTORY: Social History   Socioeconomic History   Marital status: Single    Spouse Bray: Not on file   Number of children: 0   Years of education: BS   Highest education level: Not on file  Occupational History   Occupation: [employer]  Tobacco Use   Smoking status: Never   Smokeless tobacco: Never  Vaping Use   Vaping Use: Never used  Substance and Sexual Activity   Alcohol use: No   Drug use: No   Sexual activity: Not on file  Other Topics Concern   Not on file  Social History Narrative   Lives at home, single   Right-handed   No caffeine   Social Determinants of Health   Financial Resource Strain: Not on file  Food Insecurity: Not on file  Transportation Needs: Not on file  Physical Activity: Not on file  Stress:  Not on file  Social Connections: Not on file  Intimate Partner Violence: Not on file    FAMILY HISTORY: Family History  Problem Relation Age of Onset   Hypertension Father    Glaucoma Father    Migraines Neg Hx     ALLERGIES:  has No Known Allergies.  MEDICATIONS:  Current Outpatient Medications  Medication Sig Dispense Refill   apixaban (ELIQUIS) 2.5 MG TABS tablet Take 1 tablet (2.5 mg total) by mouth 2 (two) times daily. 180 tablet 1   benzoyl peroxide 5 % gel Apply topically 2 (two) times daily.     hydroquinone 4 % cream Apply topically at bedtime.     Salicylic Acid (DERMACINRX ATRIX ANTIBAC WASH) 2 % LIQD Lather this product on face and wash off thoroughly every morning and evening.     tretinoin (RETIN-A) 0.025 % cream Apply topically at bedtime.     No current facility-administered medications for this visit.    Review of Systems  Constitutional:  Positive for fatigue. Negative for appetite change, chills and fever.  HENT:   Negative for hearing loss and voice change.   Eyes:  Negative for eye problems.  Respiratory:  Negative for chest tightness and cough.  Cardiovascular:  Negative for chest pain.  Gastrointestinal:  Negative for abdominal distention, abdominal pain and blood in stool.  Endocrine: Negative for hot flashes.  Genitourinary:  Negative for difficulty urinating and frequency.   Musculoskeletal:  Negative for arthralgias.  Skin:  Negative for itching and rash.  Neurological:  Positive for light-headedness. Negative for extremity weakness.  Hematological:  Negative for adenopathy.  Psychiatric/Behavioral:  Negative for confusion.    PHYSICAL EXAMINATION: ECOG PERFORMANCE STATUS: 0 - Asymptomatic Vitals:   06/30/22 1005  BP: 124/78  Pulse: 81  Temp: (!) 97.1 F (36.2 C)  SpO2: 100%   Filed Weights   06/30/22 1005  Weight: 189 lb 9.6 oz (86 kg)    Physical Exam Constitutional:      General: She is not in acute distress. HENT:     Head:  Normocephalic and atraumatic.  Eyes:     General: No scleral icterus. Cardiovascular:     Rate and Rhythm: Normal rate and regular rhythm.     Heart sounds: Normal heart sounds.  Pulmonary:     Effort: Pulmonary effort is normal. No respiratory distress.     Breath sounds: No wheezing.  Abdominal:     General: Bowel sounds are normal. There is no distension.     Palpations: Abdomen is soft.  Musculoskeletal:        General: No deformity. Normal range of motion.     Cervical back: Normal range of motion and neck supple.  Skin:    General: Skin is warm and dry.     Findings: No erythema or rash.  Neurological:     Mental Status: She is alert and oriented to person, place, and time. Mental status is at baseline.     Cranial Nerves: No cranial nerve deficit.     Coordination: Coordination normal.  Psychiatric:        Mood and Affect: Mood normal.     LABORATORY DATA:  I have reviewed the data as listed    Latest Ref Rng & Units 03/23/2022   10:20 AM 03/14/2022   11:28 PM 03/07/2022    7:38 AM  CBC  WBC 4.0 - 10.5 K/uL 5.1  8.7  7.5   Hemoglobin 12.0 - 15.0 g/dL 70.2  63.7  85.8   Hematocrit 36.0 - 46.0 % 33.5  35.3  35.1   Platelets 150 - 400 K/uL 233  304  299       Latest Ref Rng & Units 03/23/2022   10:20 AM 03/14/2022   11:28 PM 03/07/2022    7:38 AM  CMP  Glucose 70 - 99 mg/dL  97  95   BUN 6 - 20 mg/dL  12  11   Creatinine 8.50 - 1.00 mg/dL  2.77  4.12   Sodium 878 - 145 mmol/L  139  137   Potassium 3.5 - 5.1 mmol/L  3.8  3.8   Chloride 98 - 111 mmol/L  105  103   CO2 22 - 32 mmol/L  26  24   Calcium 8.9 - 10.3 mg/dL  9.4  9.4   Total Protein 6.5 - 8.1 g/dL 8.1   8.2   Total Bilirubin 0.3 - 1.2 mg/dL 0.6   0.5   Alkaline Phos 38 - 126 U/L 83   74   AST 15 - 41 U/L 22   18   ALT 0 - 44 U/L 13   11       RADIOGRAPHIC STUDIES: I have personally reviewed  the radiological images as listed and agreed with the findings in the report. No results found.

## 2022-06-30 NOTE — Assessment & Plan Note (Signed)
Chronic micro cytosis.  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

## 2022-06-30 NOTE — Assessment & Plan Note (Addendum)
unprovoked small pulmonary embolism Patient has finished 3+ months of Eliquis 5 mg twice daily.   Clinically she is doing very well, shortness of breath has resolved. Hypercoagulable workup is negative including negative antiphospholipid syndrome, negative factor V Leiden mutation, normal protein C/S levels, negative beta glycoprotein antibodies.  Prothrombin gene mutation is pending at the time of dictation. Recommend to decrease to Eliquis 2.5 mg twice daily for long-term anticoagulation prophylaxis. Patient expresses her desire of not wanting long-term Eliquis Shared decision was made to start Eliquis 2.5 mg twice daily for at least a year and then re discussed at that point. Patient is currently not sexually active, and there is plan of IUD in the near future.

## 2022-07-02 ENCOUNTER — Ambulatory Visit (INDEPENDENT_AMBULATORY_CARE_PROVIDER_SITE_OTHER): Payer: BC Managed Care – PPO | Admitting: Student in an Organized Health Care Education/Training Program

## 2022-07-02 ENCOUNTER — Encounter: Payer: Self-pay | Admitting: Student in an Organized Health Care Education/Training Program

## 2022-07-02 VITALS — BP 130/80 | HR 82 | Temp 97.5°F | Ht 66.0 in | Wt 191.0 lb

## 2022-07-02 DIAGNOSIS — I2699 Other pulmonary embolism without acute cor pulmonale: Secondary | ICD-10-CM

## 2022-07-02 DIAGNOSIS — J90 Pleural effusion, not elsewhere classified: Secondary | ICD-10-CM | POA: Diagnosis not present

## 2022-07-02 LAB — PROTHROMBIN GENE MUTATION

## 2022-07-02 NOTE — Progress Notes (Signed)
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 On my point of care ultrasound evaluation of the pleural effusion (9/16 & 9/20), the fluid appeared to be free flowing and simple appearing. Today, repeat POCUS shows possible small pleural effusion. This could potentially explain Lisa Bray symptoms of chest pain. I did recommend further imaging to re-evaluate Lisa Bray pleural space, and the patient would prefer to not be exposed to further radiation. I will order an MRI of the chest without contrast to that effect.   Workup for cause behind the PE has so far included blood work for anti-phospholipid which has been negative and for SLE which has returned a positive SSA (Anti-Ro) antibody. ANA was not sent as part of the SLE panel. Coagulopathy workup  sent by hematology was non-revealing. Lisa Bray has been switched over to Eliquis 2.5 mg bid to continue for a year based on the patient's preference. I would recommend extended phase anti-coagulation based on ACCP guidelines.  The source of Lisa Bray pleural effusion at this point is unclear. The differentials include an effusion associated with pulmonary embolism, or an inflammatory exudate. It is possible that Lisa Bray had a pulmonary embolism resulting in lung infarction (noted on CT) followed by the development of a pleural effusion. It is also possible Lisa Bray developed pleuritis as the initial presentation of an auto-immune disorder resulting in the development of a pleural effusion triggering a pulmonary embolus.  - MR CHEST WO CONTRAST; Future   Return in about 1 year (around 07/03/2023).  I spent 30 minutes caring for this patient today, including preparing to see the patient, obtaining a medical history , reviewing a separately obtained history, performing a  medically appropriate examination and/or evaluation, counseling and educating the patient/family/caregiver, ordering medications, tests, or procedures, and documenting clinical information in the electronic health record  Armando Reichert, MD [facility] 07/02/2022 2:13 PM    End of visit medications:  No orders of the defined types were placed in this encounter.    Current Outpatient Medications:    apixaban (ELIQUIS) 2.5 MG TABS tablet, Take 1 tablet (2.5 mg total) by mouth 2 (two) times daily., Disp: 180 tablet, Rfl: 1   benzoyl peroxide 5 % gel, Apply topically 2 (two) times daily., Disp: , Rfl:    hydroquinone 4 % cream, Apply topically at bedtime., Disp: , Rfl:    Salicylic Acid (DERMACINRX ATRIX ANTIBAC [place]) 2 % LIQD, Lather this product on face and wash off thoroughly every morning and evening., Disp: , Rfl:    tretinoin (RETIN-A) 0.025 % cream, Apply topically at bedtime., Disp: , Rfl:    Subjective:   PATIENT ID: Lisa Bray, Lisa Bray  Chief Complaint  Patient presents with   Follow-up    Breathing is fine. Pain off and on in the left side for 3 weeks. More persistent in the last week.    HPI  Lisa Bray is a 32 year old female with a diagnosis of unprovoked PE and associated small left sided pleural effusion who is presenting to clinic today for follow up.   Lisa Bray was first seen in the ED on 03/01/2022 for chest pain and worked up with a CT chest with PE protocol showed a left sided pleural effusion in addition to a small lingular  pulmonary embolus. Lisa Bray was started on Apixaban and discharged. Lisa Bray re-presented on 03/07/2022 with left sided shoulder pain. Repeat CT chest showed small lingular pulmonary embolus and a growth in the size of the pleural effusion.  At the time of my initial evaluation, Lisa Bray symptoms were beginning to improve. Lisa Bray is a non-smoker, and denies vaping. Lisa Bray is not on OCP or other hormonal  therapy. Lisa Bray reports no personal or family history of auto-immune disorders, and denies joint pains, rashes, raynaud's phenomena, difficulty swallowing, or personal history of blood clots. Lisa Bray is not sexually active and has not had any miscarriages. There is no family history of repeat miscarriages.  Since our last visit, Lisa Bray denies any hemoptysis. Lisa Bray reports an intermittent 3/10 pain in Lisa Bray left chest that is sporadic. It is not related to exertion and not associated with shortness of breath or cough. Lisa Bray has been seen by hematology as well as by rheumatology. Lisa Bray was started on 2.5 mg bid of Eliquis in coordination with hematology to be continued for at least a year. Lisa Bray is closely followed by rheumatology and is not currently on any immune suppression.  Ancillary information including prior medications, full medical/surgical/family/social histories, and PFTs (when available) are listed below and have been reviewed.   Review of Systems  Constitutional:  Negative for chills, fever, malaise/fatigue and weight loss.  Respiratory:  Negative for cough, hemoptysis (minimal, dime sized) and sputum production.   Cardiovascular:  Negative for chest pain, leg swelling and PND.  Gastrointestinal:  Negative for heartburn and nausea.  Neurological:  Negative for dizziness and focal weakness.     Objective:   Vitals:   07/02/22 1353  BP: 130/80  Pulse: 82  Temp: (!) 97.5 F (36.4 C)  SpO2: 100%  Weight: 191 lb (86.6 kg)  Height: 5\' 6"  (1.676 m)   100% on RA  BMI Readings from Last 3 Encounters:  07/02/22 30.83 kg/m  06/30/22 30.60 kg/m  04/20/22 30.31 kg/m   Wt Readings from Last 3 Encounters:  07/02/22 191 lb (86.6 kg)  06/30/22 189 lb 9.6 oz (86 kg)  04/20/22 187 lb 12.8 oz (85.2 kg)    Physical Exam Constitutional:      Appearance: Lisa Bray is normal weight.  HENT:     Head: Normocephalic.     Right Ear: Tympanic membrane normal.     Nose: Nose normal.     Mouth/Throat:      Mouth: Mucous membranes are moist.  Eyes:     Extraocular Movements: Extraocular movements intact.     Pupils: Pupils are equal, round, and reactive to light.  Cardiovascular:     Rate and Rhythm: Normal rate and regular rhythm.     Pulses: Normal pulses.     Heart sounds: Normal heart sounds.  Pulmonary:     Effort: Pulmonary effort is normal.     Breath sounds: No rales (left lower lung field).  Musculoskeletal:        General: Normal range of motion.     Cervical back: Normal range of motion.  Neurological:     General: No focal deficit present.     Mental Status: Lisa Bray is alert and oriented to person, place, and time. Mental status is at baseline.       Ancillary Information    Past Medical History:  Diagnosis Date   Neurogenic syncope    Recurrent upper respiratory infection (URI)      Family History  Problem Relation Age of Onset   Hypertension Father  Glaucoma Father    Migraines Neg Hx      Past Surgical History:  Procedure Laterality Date   BREAST BIOPSY Right     Social History   Socioeconomic History   Marital status: Single    Spouse name: Not on file   Number of children: 0   Years of education: BS   Highest education level: Not on file  Occupational History   Occupation: [employer]  Tobacco Use   Smoking status: Never   Smokeless tobacco: Never  Vaping Use   Vaping Use: Never used  Substance and Sexual Activity   Alcohol use: No   Drug use: No   Sexual activity: Not on file  Other Topics Concern   Not on file  Social History Narrative   Lives at home, single   Right-handed   No caffeine   Social Determinants of Health   Financial Resource Strain: Not on file  Food Insecurity: Not on file  Transportation Needs: Not on file  Physical Activity: Not on file  Stress: Not on file  Social Connections: Not on file  Intimate Partner Violence: Not on file     No Known Allergies   CBC    Component Value Date/Time   WBC 5.1  03/23/2022 1020   RBC 4.61 03/23/2022 1020   RBC 4.61 03/23/2022 1020   HGB 11.8 (L) 03/23/2022 1020   HCT 33.5 (L) 03/23/2022 1020   PLT 233 03/23/2022 1020   MCV 72.7 (L) 03/23/2022 1020   MCH 25.6 (L) 03/23/2022 1020   MCHC 35.2 03/23/2022 1020   RDW 16.1 (H) 03/23/2022 1020   LYMPHSABS 1.6 03/23/2022 1020   MONOABS 0.4 03/23/2022 1020   EOSABS 0.1 03/23/2022 1020   BASOSABS 0.0 03/23/2022 1020    Pulmonary Functions Testing Results:     No data to display          Outpatient Medications Prior to Visit  Medication Sig Dispense Refill   apixaban (ELIQUIS) 2.5 MG TABS tablet Take 1 tablet (2.5 mg total) by mouth 2 (two) times daily. 180 tablet 1   benzoyl peroxide 5 % gel Apply topically 2 (two) times daily.     hydroquinone 4 % cream Apply topically at bedtime.     Salicylic Acid (DERMACINRX ATRIX ANTIBAC WASH) 2 % LIQD Lather this product on face and wash off thoroughly every morning and evening.     tretinoin (RETIN-A) 0.025 % cream Apply topically at bedtime.     No facility-administered medications prior to visit.

## 2022-07-06 ENCOUNTER — Ambulatory Visit: Admission: RE | Admit: 2022-07-06 | Payer: BC Managed Care – PPO | Source: Ambulatory Visit

## 2022-07-06 ENCOUNTER — Telehealth: Payer: Self-pay | Admitting: Student in an Organized Health Care Education/Training Program

## 2022-07-06 ENCOUNTER — Ambulatory Visit
Admission: RE | Admit: 2022-07-06 | Discharge: 2022-07-06 | Disposition: A | Discharge status: Home / Self Care | Payer: BC Managed Care – PPO | Source: Ambulatory Visit | Attending: Student in an Organized Health Care Education/Training Program | Admitting: Student in an Organized Health Care Education/Training Program

## 2022-07-06 DIAGNOSIS — J90 Pleural effusion, not elsewhere classified: Secondary | ICD-10-CM

## 2022-07-06 NOTE — Telephone Encounter (Signed)
I spoke to Lisa Bray and explained the updates to her. Will proceed with an US of the chest prior to re-attempting the prior auth on the MRI.

## 2022-07-06 NOTE — Telephone Encounter (Signed)
noted 

## 2022-07-06 NOTE — Telephone Encounter (Signed)
Dr. Genia Harold, please advise. Thanks

## 2022-07-06 NOTE — Telephone Encounter (Signed)
When I spoke with Lisa Bray she stated that she would call her insurance tomorrow to find out why they denied the MRI CT

## 2022-07-06 NOTE — Telephone Encounter (Signed)
I did speak with Lisa Bray and she was aware that the MRI Chest was denied by insurance and even after Dr. Genia Harold did Peer to Peer they would not agree. Dr. Genia Harold placed order for [place] chest and it was scheduled for today at [facility] and patient was aware of the new appt information

## 2022-07-27 ENCOUNTER — Encounter: Payer: Self-pay | Admitting: Student in an Organized Health Care Education/Training Program

## 2022-08-06 NOTE — Telephone Encounter (Signed)
Patient called into the office today wanted to know if we had rescheduled the MR chest which the insurance denied even when Dr. Genia Harold did peer to peer.  Does this need to be done. I had closed out the previous order because insurance denied.  I'm not really sure what else needs to be done. The patient states Dr. Genia Harold just needs to call insurance and make some kind of comment. I guess I would need to speak with them to verify what is needed.

## 2022-08-21 ENCOUNTER — Other Ambulatory Visit: Payer: Self-pay | Admitting: Student in an Organized Health Care Education/Training Program

## 2022-08-21 ENCOUNTER — Telehealth: Payer: Self-pay | Admitting: Student in an Organized Health Care Education/Training Program

## 2022-08-21 NOTE — Telephone Encounter (Signed)
Patient is calling to get the information for her MRI.  She stated she has not heard from the doctor yet.  Please call patient to give her an update.  CB# [phone]

## 2022-08-21 NOTE — Progress Notes (Signed)
Peer to peer/appear process yielded approval of chest MRI.  Armando Reichert, MD [facility] 08/21/2022 12:47 PM

## 2022-08-21 NOTE — Telephone Encounter (Signed)
I have spoke with Lisa Bray and her MR Brain has been scheduled on 08/24/22 @ 5:00pm and she is aware

## 2022-08-24 ENCOUNTER — Ambulatory Visit
Admission: RE | Admit: 2022-08-24 | Discharge: 2022-08-24 | Disposition: A | Discharge status: Home / Self Care | Payer: BC Managed Care – PPO | Source: Ambulatory Visit | Attending: Student in an Organized Health Care Education/Training Program | Admitting: Student in an Organized Health Care Education/Training Program

## 2022-08-24 DIAGNOSIS — J90 Pleural effusion, not elsewhere classified: Secondary | ICD-10-CM | POA: Diagnosis present

## 2022-08-25 NOTE — Telephone Encounter (Signed)
Insurance called and peer to peer revisited. MRI performed, see result in medical record.  Armando Reichert, MD [facility] 08/25/2022 4:21 PM

## 2022-11-28 ENCOUNTER — Other Ambulatory Visit: Payer: Self-pay

## 2022-11-28 ENCOUNTER — Emergency Department: Payer: BC Managed Care – PPO

## 2022-11-28 ENCOUNTER — Emergency Department
Admission: EM | Admit: 2022-11-28 | Discharge: 2022-11-28 | Disposition: A | Discharge status: Home / Self Care | Payer: BC Managed Care – PPO | Attending: Emergency Medicine | Admitting: Emergency Medicine

## 2022-11-28 DIAGNOSIS — R519 Headache, unspecified: Secondary | ICD-10-CM | POA: Diagnosis present

## 2022-11-28 DIAGNOSIS — G4489 Other headache syndrome: Secondary | ICD-10-CM | POA: Insufficient documentation

## 2022-11-28 DIAGNOSIS — Z7901 Long term (current) use of anticoagulants: Secondary | ICD-10-CM | POA: Insufficient documentation

## 2022-11-28 LAB — CBC
HCT: 34.6 % — ABNORMAL LOW (ref 36.0–46.0)
Hemoglobin: 11.9 g/dL — ABNORMAL LOW (ref 12.0–15.0)
MCH: 25.1 pg — ABNORMAL LOW (ref 26.0–34.0)
MCHC: 34.4 g/dL (ref 30.0–36.0)
MCV: 72.8 fL — ABNORMAL LOW (ref 80.0–100.0)
Platelets: 247 10*3/uL (ref 150–400)
RBC: 4.75 MIL/uL (ref 3.87–5.11)
RDW: 16.3 % — ABNORMAL HIGH (ref 11.5–15.5)
WBC: 8.2 10*3/uL (ref 4.0–10.5)
nRBC: 0 % (ref 0.0–0.2)

## 2022-11-28 LAB — COMPREHENSIVE METABOLIC PANEL
ALT: 16 U/L (ref 0–44)
AST: 26 U/L (ref 15–41)
Albumin: 3.7 g/dL (ref 3.5–5.0)
Alkaline Phosphatase: 73 U/L (ref 38–126)
Anion gap: 10 (ref 5–15)
BUN: 12 mg/dL (ref 6–20)
CO2: 25 mmol/L (ref 22–32)
Calcium: 9.2 mg/dL (ref 8.9–10.3)
Chloride: 101 mmol/L (ref 98–111)
Creatinine, Ser: 0.67 mg/dL (ref 0.44–1.00)
GFR, Estimated: >60 mL/min (ref >60)
Glucose, Bld: 87 mg/dL (ref 70–99)
Potassium: 4 mmol/L (ref 3.5–5.1)
Sodium: 136 mmol/L (ref 135–145)
Total Bilirubin: 0.4 mg/dL (ref 0.3–1.2)
Total Protein: 7.6 g/dL (ref 6.5–8.1)

## 2022-11-28 MED ORDER — PROCHLORPERAZINE MALEATE 10 MG PO TABS
10.0000 mg | ORAL_TABLET | Freq: Once | ORAL | Status: DC
  Filled 2022-11-28: qty 1

## 2022-11-28 MED ORDER — BUTALBITAL-APAP-CAFFEINE 50-325-40 MG PO TABS: 2.0000 | ORAL_TABLET | Freq: Once | ORAL | Status: DC

## 2022-11-28 NOTE — ED Notes (Signed)
Patient transported to CT 

## 2022-11-28 NOTE — ED Triage Notes (Signed)
Pt to ed from home via POV for headache x 1 week. Pt advised she has not taken anything OTC to alleviate the headache. Pt is caox4, in no acute distress and ambulatory in triage.

## 2022-11-28 NOTE — Discharge Instructions (Signed)
Use Tylenol for pain and fevers.  Up to 1000 mg per dose, up to 4 times per day.  Do not take more than 4000 mg of Tylenol/acetaminophen within 24 hours..  No NSAIDs with your Eliquis.  This would include ibuprofen/Advil, Aleve, BC or Goody's, aspirin  But Tylenol is okay

## 2022-11-28 NOTE — ED Provider Notes (Signed)
[facility] Provider Note    Event Date/Time   First MD Initiated Contact with Patient 11/28/22 [phone]     (approximate)   History   Headache   HPI  Lisa Bray is a 32 y.o. female who presents to the ED for evaluation of Headache   I reviewed rheumatology clinic visit from 5/16.  Negative ANA, positive SSA.  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  Patient presents to the ED for evaluation of a few days of a persistent "heavy" sensation to her head.  She reports it is not quite headache, and indicates that is quite mild up to 2/10 intensity.  It has been persistent.  She denies any falls or injuries, syncope, dizziness, weakness or sensation changes, vision changes or anything beyond the headache.  Due to her being on the Eliquis, she was uncertain what medications she could safely take for her headache as she often takes ibuprofen for headaches prior to being started on anticoagulation.  Physical Exam   Triage Vital Signs: ED Triage Vitals  Enc Vitals Group     BP 11/28/22 0021 129/83     Pulse Rate 11/28/22 0021 76     Resp 11/28/22 0021 16     Temp 11/28/22 0021 98.4 F (36.9 C)     Temp Source 11/28/22 0021 Oral     SpO2 11/28/22 0021 98 %     Weight 11/28/22 0022 192 lb (87.1 kg)     Height 11/28/22 0022 5\' 6"  (1.676 m)     Head Circumference --      Peak Flow --      Pain Score 11/28/22 0021 3     Pain Loc --      Pain Edu? --      Excl. in GC? --     Most recent vital signs: Vitals:   11/28/22 0021  BP: 129/83  Pulse: 76  Resp: 16  Temp: 98.4 F (36.9 C)  SpO2: 98%    General: Awake, no distress.  Ambulatory independently with normal gait.  Well-appearing. CV:  Good peripheral perfusion.  Resp:  Normal effort.  Abd:  No distention.  MSK:  No deformity noted.  Neuro:  No focal deficits appreciated. Cranial nerves II through XII intact 5/5 strength and sensation in all 4 extremities Other:     ED Results / Procedures  / Treatments   Labs (all labs ordered are listed, but only abnormal results are displayed) Labs Reviewed  CBC - Abnormal; Notable for the following components:      Result Value   Hemoglobin 11.9 (*)    HCT 34.6 (*)    MCV 72.8 (*)    MCH 25.1 (*)    RDW 16.3 (*)    All other components within normal limits  COMPREHENSIVE METABOLIC PANEL    EKG   RADIOLOGY CT head interpreted by me without evidence of acute intracranial pathology  Official radiology report(s): CT HEAD WO CONTRAST ( )  Result Date: 11/28/2022 CLINICAL DATA:  Severe headache in the setting of Eliquis. EXAM: CT HEAD WITHOUT CONTRAST TECHNIQUE: Contiguous axial images were obtained from the base of the skull through the vertex without intravenous contrast. RADIATION DOSE REDUCTION: This exam was performed according to the departmental dose-optimization program which includes automated exposure control, adjustment of the mA and/or kV according to patient size and/or use of iterative reconstruction technique. COMPARISON:  06/21/2019 brain MRI FINDINGS: Brain: No evidence of acute infarction, hemorrhage, hydrocephalus, extra-axial collection or  mass lesion/mass effect. White matter low densities that are underestimated relative to prior brain MRI Vascular: No hyperdense vessel or unexpected calcification. Skull: Normal. Negative for fracture or focal lesion. Sinuses/Orbits: No acute finding. IMPRESSION: Negative for intracranial hemorrhage. Electronically Signed   By: Tiburcio Pea M.D.   On: 11/28/2022 05:45    PROCEDURES and INTERVENTIONS:  Procedures  Medications - No data to display   IMPRESSION / MDM / ASSESSMENT AND PLAN / ED COURSE  I reviewed the triage vital signs and the nursing notes.  Differential diagnosis includes, but is not limited to, ICH, migraine, tension headache or sinus headache.  Symptomatic anemia  {Patient presents with symptoms of an acute illness or injury that is potentially  life-threatening.  Pleasant young woman on Eliquis due to PE presents with a mild headache suitable for outpatient management.  CT head is reassuring without bleed or derangements noted.  Normal metabolic panel and CBC with mild microcytic anemia that I suspect is chronic and related to iron deficiency.  I offered safe medication such as Fioricet but she declines indicating she can just take Tylenol at home and just wanted to make sure she was okay.  Discharged with return precautions      FINAL CLINICAL IMPRESSION(S) / ED DIAGNOSES   Final diagnoses:  Other headache syndrome     Rx / DC Orders   ED Discharge Orders     None        Note:  This document was prepared using Dragon voice recognition software and may include unintentional dictation errors.   Delton Prairie, MD 11/28/22 0600

## 2022-12-28 ENCOUNTER — Inpatient Hospital Stay: Payer: BC Managed Care – PPO | Admitting: Oncology

## 2022-12-28 ENCOUNTER — Inpatient Hospital Stay: Payer: BC Managed Care – PPO

## 2022-12-29 ENCOUNTER — Ambulatory Visit: Payer: BC Managed Care – PPO | Admitting: Oncology

## 2022-12-29 ENCOUNTER — Other Ambulatory Visit: Payer: BC Managed Care – PPO

## 2023-01-01 ENCOUNTER — Inpatient Hospital Stay: Payer: BC Managed Care – PPO | Attending: Oncology

## 2023-01-01 ENCOUNTER — Other Ambulatory Visit: Payer: Self-pay

## 2023-01-01 ENCOUNTER — Encounter: Payer: Self-pay | Admitting: Oncology

## 2023-01-01 ENCOUNTER — Inpatient Hospital Stay: Payer: BC Managed Care – PPO | Admitting: Oncology

## 2023-01-01 VITALS — BP 116/84 | HR 71 | Temp 97.7°F | Resp 18 | Wt 201.4 lb

## 2023-01-01 DIAGNOSIS — D509 Iron deficiency anemia, unspecified: Secondary | ICD-10-CM | POA: Insufficient documentation

## 2023-01-01 DIAGNOSIS — I2699 Other pulmonary embolism without acute cor pulmonale: Secondary | ICD-10-CM

## 2023-01-01 DIAGNOSIS — R760 Raised antibody titer: Secondary | ICD-10-CM | POA: Insufficient documentation

## 2023-01-01 DIAGNOSIS — Z7901 Long term (current) use of anticoagulants: Secondary | ICD-10-CM | POA: Diagnosis not present

## 2023-01-01 DIAGNOSIS — J90 Pleural effusion, not elsewhere classified: Secondary | ICD-10-CM | POA: Insufficient documentation

## 2023-01-01 DIAGNOSIS — R768 Other specified abnormal immunological findings in serum: Secondary | ICD-10-CM | POA: Diagnosis not present

## 2023-01-01 DIAGNOSIS — D582 Other hemoglobinopathies: Secondary | ICD-10-CM | POA: Insufficient documentation

## 2023-01-01 DIAGNOSIS — D500 Iron deficiency anemia secondary to blood loss (chronic): Secondary | ICD-10-CM | POA: Diagnosis not present

## 2023-01-01 DIAGNOSIS — R7689 Other specified abnormal immunological findings in serum: Secondary | ICD-10-CM

## 2023-01-01 DIAGNOSIS — R718 Other abnormality of red blood cells: Secondary | ICD-10-CM

## 2023-01-01 LAB — CBC WITH DIFFERENTIAL/PLATELET
Abs Immature Granulocytes: 0.01 10*3/uL (ref 0.00–0.07)
Basophils Absolute: 0 10*3/uL (ref 0.0–0.1)
Basophils Relative: 1 %
Eosinophils Absolute: 0.1 10*3/uL (ref 0.0–0.5)
Eosinophils Relative: 2 %
HCT: 31.4 % — ABNORMAL LOW (ref 36.0–46.0)
Hemoglobin: 10.8 g/dL — ABNORMAL LOW (ref 12.0–15.0)
Immature Granulocytes: 0 %
Lymphocytes Relative: 31 %
Lymphs Abs: 2 10*3/uL (ref 0.7–4.0)
MCH: 25.1 pg — ABNORMAL LOW (ref 26.0–34.0)
MCHC: 34.4 g/dL (ref 30.0–36.0)
MCV: 72.9 fL — ABNORMAL LOW (ref 80.0–100.0)
Monocytes Absolute: 0.7 10*3/uL (ref 0.1–1.0)
Monocytes Relative: 10 %
Neutro Abs: 3.7 10*3/uL (ref 1.7–7.7)
Neutrophils Relative %: 56 %
Platelets: 235 10*3/uL (ref 150–400)
RBC: 4.31 MIL/uL (ref 3.87–5.11)
RDW: 16.3 % — ABNORMAL HIGH (ref 11.5–15.5)
WBC: 6.5 10*3/uL (ref 4.0–10.5)
nRBC: 0 % (ref 0.0–0.2)

## 2023-01-01 LAB — COMPREHENSIVE METABOLIC PANEL
ALT: 13 U/L (ref 0–44)
AST: 20 U/L (ref 15–41)
Albumin: 3.6 g/dL (ref 3.5–5.0)
Alkaline Phosphatase: 73 U/L (ref 38–126)
Anion gap: 7 (ref 5–15)
BUN: 8 mg/dL (ref 6–20)
CO2: 24 mmol/L (ref 22–32)
Calcium: 8.4 mg/dL — ABNORMAL LOW (ref 8.9–10.3)
Chloride: 105 mmol/L (ref 98–111)
Creatinine, Ser: 0.64 mg/dL (ref 0.44–1.00)
GFR, Estimated: >60 mL/min (ref >60)
Glucose, Bld: 91 mg/dL (ref 70–99)
Potassium: 3.8 mmol/L (ref 3.5–5.1)
Sodium: 136 mmol/L (ref 135–145)
Total Bilirubin: 0.3 mg/dL (ref 0.3–1.2)
Total Protein: 7.2 g/dL (ref 6.5–8.1)

## 2023-01-01 LAB — IRON AND TIBC
Iron: 39 ug/dL (ref 28–170)
Saturation Ratios: 11 % (ref 10.4–31.8)
TIBC: 370 ug/dL (ref 250–450)
UIBC: 331 ug/dL

## 2023-01-01 LAB — FERRITIN: Ferritin: 7 ng/mL — ABNORMAL LOW (ref 11–307)

## 2023-01-01 MED ORDER — APIXABAN 2.5 MG PO TABS: 2.5000 mg | ORAL_TABLET | Freq: Two times a day (BID) | ORAL | 1 refills | Status: DC

## 2023-01-01 NOTE — Assessment & Plan Note (Addendum)
Chronic microcytosis.   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

## 2023-01-01 NOTE — Assessment & Plan Note (Addendum)
unprovoked small pulmonary embolism Patient has finished 3+ months of Eliquis 5 mg twice daily.   Clinically she is doing very well, shortness of breath has resolved. Hypercoagulable workup is negative including negative antiphospholipid syndrome, negative factor V Leiden mutation, normal protein C/S levels, negative beta glycoprotein antibodies, negative prothrombin gene mutation  Recommend to decrease to Eliquis 2.5 mg twice daily for long-term anticoagulation prophylaxis. Patient expresses her desire of not wanting long-term Eliquis, she agrees for another 6 months of anticoagulation.  We will rediscuss at the next visit.  She is interested to be switched to aspirin after that.

## 2023-01-01 NOTE — Assessment & Plan Note (Signed)
Possible emerging connective tissue disease however not meeting indication of starting medication.  Rheumatology recommend watchful waiting.

## 2023-01-01 NOTE — Progress Notes (Signed)
Hematology/Oncology Consult note [phone] [phone]         Patient Care Team: Wilfrid Lund, PA as PCP - General (Family Medicine) CHIEF COMPLAINTS/REASON FOR VISIT:  Follow-up for pulmonary embolism  ASSESSMENT & PLAN:   Pulmonary embolus (HCC) unprovoked small pulmonary embolism Patient has finished 3+ months of Eliquis 5 mg twice daily.   Clinically she is doing very well, shortness of breath has resolved. Hypercoagulable workup is negative including negative antiphospholipid syndrome, negative factor V Leiden mutation, normal protein C/S levels, negative beta glycoprotein antibodies, negative prothrombin gene mutation  Recommend to decrease to Eliquis 2.5 mg twice daily for long-term anticoagulation prophylaxis. Patient expresses her desire of not wanting long-term Eliquis, she agrees for another 6 months of anticoagulation.  We will rediscuss at the next visit.  She is interested to be switched to aspirin after that.  Hemoglobin C trait (HCC) Chronic microcytosis.  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  ANA positive Possible emerging connective tissue disease however not meeting indication of starting medication.  Rheumatology recommend watchful waiting.  Orders Placed This Encounter  Procedures   CBC with Differential (Cancer Center Only)    Standing Status:   Future    Standing Expiration Date:   01/01/2024   CMP (Cancer Center only)    Standing Status:   Future    Standing Expiration Date:   01/01/2024   Iron and TIBC    Standing Status:   Future    Standing Expiration Date:   01/01/2024   Ferritin    Standing Status:   Future    Standing Expiration Date:   01/01/2024   Follow-up in 6 months. All questions were answered. The patient knows to call the clinic with any problems, questions or concerns.  Rickard Patience, MD, PhD [facility] Hematology Oncology 01/01/2023    HISTORY OF PRESENTING ILLNESS:   Lisa Bray is a  32 y.o.  female with PMH listed below was seen in consultation at the request of  Deeann Saint, MD  for evaluation of pulmonary embolism  03/01/2022, patient felt left upper back discomfort to feel like pulled muscle.  She went to emergency room for further evaluation.  Denies any cough, fever or chills. Chest x-ray showed airspace opacity in the lingula may reflect infiltrate. CT angio chest PE protocol showed small pulmonary emboli within the lingula, associated groundglass of airspace opacity in the lingula.  Small left pleural effusion.  03/02/2022, bilateral lower extremity ultrasound negative for DVT.  Patient was started on blood thinner with Eliquis and empiric azithromycin and discharged home.  She went back to emergency room on 03/07/2022, for evaluation of recurrent pain which was significant as severe but from left collarbone down to her chest.  03/07/2022 repeat CT chest angiogram showed tiny nonocclusive pulmonary embolism in a lingular branch of the left pulmonary artery. Probable additional peripheral embolus in the lingula, similar to prior. No new or progressive pulmonary embolic disease evident.  Interval evolution of the lingular infarct, now with more consolidative appearance although size is similar to previous. Interval progression of left pleural effusion, now moderate in size.  Patient was seen by pulmonology for left pleural effusion, patient had point of focus ultrasound done which was reassuring the left pleural effusion mostly resolved.  Only trace left.  Patient had negative antiphospholipid antibodies, ANA was positive-SSA antibody positive.  She has been referred to both hematology and rheumatology for further  evaluation.  Patient denies previous personal history of thrombosis or family history of thrombosis.  Denies any family history of cancer.  Denies any OCP use, recent immobilization events prior to  diagnosis of PE.  Patient had positive ANA and was evaluated by rheumatology Dr. Allena Katz who feels that her clinical history is suspicious for emerging systemic connective tissue disease although currently she is asymptomatic, she has no symptoms for Sjogren syndrome lupus.  Dr. Allena Katz recommend long-term watchful waiting.-If she becomes pregnant in the future, Dr. Allena Katz recommend her to stop Plaquenil to reduce risk of neonatal lupus.  INTERVAL HISTORY Lisa Bray is a 32 y.o. female who has above history reviewed by me today presents for follow up visit for history of acute unprovoked pulmonary embolism. Patient reports feeling well.  She takes Eliquis 2.5 mg twice daily Denies any bleeding events.  Menstrual period is heavy.   No new complaints.   MEDICAL HISTORY:  Past Medical History:  Diagnosis Date   Neurogenic syncope    Recurrent upper respiratory infection (URI)     SURGICAL HISTORY: Past Surgical History:  Procedure Laterality Date   BREAST BIOPSY Right     SOCIAL HISTORY: Social History   Socioeconomic History   Marital status: Single    Spouse name: Not on file   Number of children: 0   Years of education: BS   Highest education level: Not on file  Occupational History   Occupation: [employer]  Tobacco Use   Smoking status: Never   Smokeless tobacco: Never  Vaping Use   Vaping status: Never Used  Substance and Sexual Activity   Alcohol use: No   Drug use: No   Sexual activity: Not on file  Other Topics Concern   Not on file  Social History Narrative   Lives at home, single   Right-handed   No caffeine   Social Determinants of Health   Financial Resource Strain: Not on file  Food Insecurity: Not on file  Transportation Needs: Not on file  Physical Activity: Not on file  Stress: Not on file  Social Connections: Unknown (10/31/2021)   [facility]   Social Network    Social Network: Not on file  Intimate Partner Violence: Unknown  (09/24/2021)   Received from [facility]   HITS    Physically Hurt: Not on file    Insult or Talk Down To: Not on file    Threaten Physical Harm: Not on file    Scream or Curse: Not on file    FAMILY HISTORY: Family History  Problem Relation Age of Onset   Hypertension Father    Glaucoma Father    Migraines Neg Hx     ALLERGIES:  has No Known Allergies.  MEDICATIONS:  Current Outpatient Medications  Medication Sig Dispense Refill   benzoyl peroxide 5 % gel Apply topically 2 (two) times daily.     hydroquinone 4 % cream Apply topically at bedtime.     Salicylic Acid (DERMACINRX ATRIX ANTIBAC WASH) 2 % LIQD Lather this product on face and wash off thoroughly every morning and evening.     tretinoin (RETIN-A) 0.025 % cream Apply topically at bedtime.     apixaban (ELIQUIS) 2.5 MG TABS tablet Take 1 tablet (2.5 mg total) by mouth 2 (two) times daily. 180 tablet 1   No current facility-administered medications for this visit.    Review of Systems  Constitutional:  Positive for fatigue. Negative for appetite change, chills and fever.  HENT:   Negative for hearing loss and voice change.   Eyes:  Negative for eye problems.  Respiratory:  Negative for chest tightness and cough.   Cardiovascular:  Negative for chest pain.  Gastrointestinal:  Negative for abdominal distention, abdominal pain and blood in stool.  Endocrine: Negative for hot flashes.  Genitourinary:  Negative for difficulty urinating and frequency.   Musculoskeletal:  Negative for arthralgias.  Skin:  Negative for itching and rash.  Neurological:  Positive for light-headedness. Negative for extremity weakness.  Hematological:  Negative for adenopathy.  Psychiatric/Behavioral:  Negative for confusion.    PHYSICAL EXAMINATION: ECOG PERFORMANCE STATUS: 0 - Asymptomatic Vitals:   01/01/23 1012  BP: 116/84  Pulse: 71  Resp: 18  Temp: 97.7 F (36.5 C)  SpO2: 100%   Filed Weights   01/01/23 1012  Weight: 201 lb  6.4 oz (91.4 kg)    Physical Exam Constitutional:      General: She is not in acute distress. HENT:     Head: Normocephalic and atraumatic.  Eyes:     General: No scleral icterus. Cardiovascular:     Rate and Rhythm: Normal rate and regular rhythm.     Heart sounds: Normal heart sounds.  Pulmonary:     Effort: Pulmonary effort is normal. No respiratory distress.     Breath sounds: No wheezing.  Abdominal:     General: Bowel sounds are normal. There is no distension.     Palpations: Abdomen is soft.  Musculoskeletal:        General: No deformity. Normal range of motion.     Cervical back: Normal range of motion and neck supple.  Skin:    General: Skin is warm and dry.     Findings: No erythema or rash.  Neurological:     Mental Status: She is alert and oriented to person, place, and time. Mental status is at baseline.     Cranial Nerves: No cranial nerve deficit.     Coordination: Coordination normal.  Psychiatric:        Mood and Affect: Mood normal.     LABORATORY DATA:  I have reviewed the data as listed    Latest Ref Rng & Units 01/01/2023   10:00 AM 11/28/2022   12:21 AM 03/23/2022   10:20 AM  CBC  WBC 4.0 - 10.5 K/uL 6.5  8.2  5.1   Hemoglobin 12.0 - 15.0 g/dL 16.1  09.6  04.5   Hematocrit 36.0 - 46.0 % 31.4  34.6  33.5   Platelets 150 - 400 K/uL 235  247  233       Latest Ref Rng & Units 01/01/2023   10:00 AM 11/28/2022   12:21 AM 03/23/2022   10:20 AM  CMP  Glucose 70 - 99 mg/dL 91  87    BUN 6 - 20 mg/dL 8  12    Creatinine 4.09 - 1.00 mg/dL 8.11  9.14    Sodium 782 - 145 mmol/L 136  136    Potassium 3.5 - 5.1 mmol/L 3.8  4.0    Chloride 98 - 111 mmol/L 105  101    CO2 22 - 32 mmol/L 24  25    Calcium 8.9 - 10.3 mg/dL 8.4  9.2    Total Protein 6.5 - 8.1 g/dL 7.2  7.6  8.1   Total Bilirubin 0.3 - 1.2 mg/dL 0.3  0.4  0.6   Alkaline Phos 38 - 126 U/L 73  73  83  AST 15 - 41 U/L 20  26  22    ALT 0 - 44 U/L 13  16  13        RADIOGRAPHIC STUDIES: I  have personally reviewed the radiological images as listed and agreed with the findings in the report. CT HEAD WO CONTRAST ( )  Result Date: 11/28/2022 CLINICAL DATA:  Severe headache in the setting of Eliquis. EXAM: CT HEAD WITHOUT CONTRAST TECHNIQUE: Contiguous axial images were obtained from the base of the skull through the vertex without intravenous contrast. RADIATION DOSE REDUCTION: This exam was performed according to the departmental dose-optimization program which includes automated exposure control, adjustment of the mA and/or kV according to patient size and/or use of iterative reconstruction technique. COMPARISON:  06/21/2019 brain MRI FINDINGS: Brain: No evidence of acute infarction, hemorrhage, hydrocephalus, extra-axial collection or mass lesion/mass effect. White matter low densities that are underestimated relative to prior brain MRI Vascular: No hyperdense vessel or unexpected calcification. Skull: Normal. Negative for fracture or focal lesion. Sinuses/Orbits: No acute finding. IMPRESSION: Negative for intracranial hemorrhage. Electronically Signed   By: Tiburcio Pea M.D.   On: 11/28/2022 05:45

## 2023-01-04 ENCOUNTER — Telehealth: Payer: Self-pay

## 2023-01-04 DIAGNOSIS — D500 Iron deficiency anemia secondary to blood loss (chronic): Secondary | ICD-10-CM

## 2023-01-04 DIAGNOSIS — I2699 Other pulmonary embolism without acute cor pulmonale: Secondary | ICD-10-CM

## 2023-01-04 MED ORDER — FERROUS SULFATE 325 (65 FE) MG PO TBEC: 325.0000 mg | DELAYED_RELEASE_TABLET | Freq: Two times a day (BID) | ORAL | 0 refills | Status: AC

## 2023-01-04 NOTE — Telephone Encounter (Signed)
Spoke to pt and informed her of MD recommendation. Pt states sshe prefers to start with oral first.  Ferrous sulfate rx sen to [facility] in [place]. Informed her that if oral iron causes constipation, she may use OTC stool softener.   Please schedule and inform pt of appt details"  labs in 3 months MD/ Venofer *new* 1-2 days after labs

## 2023-01-04 NOTE — Telephone Encounter (Signed)
-----   Message from Rickard Patience sent at 01/01/2023 10:54 PM EDT ----- Please let patient know that her iron levels are low, likely due to heavy menstrual period.  One option is to start oral iron supplementation, ferrous sulfate BID, repeat blood work and see me in 3 months, if no improvement, will proceed with IV Venofer.  Alternative option is to start IV Venofer, plan weekly x4  Let me know how she would like to proceed. Thanks zy

## 2023-01-04 NOTE — Telephone Encounter (Signed)
Call returned to pt. All questions and concerns answered

## 2023-01-18 ENCOUNTER — Encounter: Payer: Self-pay | Admitting: Oncology

## 2023-02-25 ENCOUNTER — Encounter: Payer: Self-pay | Admitting: Oncology

## 2023-04-12 ENCOUNTER — Other Ambulatory Visit: Payer: BC Managed Care – PPO

## 2023-04-13 ENCOUNTER — Encounter: Payer: Self-pay | Admitting: Oncology

## 2023-04-13 ENCOUNTER — Inpatient Hospital Stay: Payer: BC Managed Care – PPO | Attending: Oncology

## 2023-04-13 DIAGNOSIS — Z7901 Long term (current) use of anticoagulants: Secondary | ICD-10-CM | POA: Insufficient documentation

## 2023-04-13 DIAGNOSIS — Z86711 Personal history of pulmonary embolism: Secondary | ICD-10-CM | POA: Insufficient documentation

## 2023-04-13 DIAGNOSIS — R718 Other abnormality of red blood cells: Secondary | ICD-10-CM

## 2023-04-13 DIAGNOSIS — D509 Iron deficiency anemia, unspecified: Secondary | ICD-10-CM | POA: Insufficient documentation

## 2023-04-13 DIAGNOSIS — I2699 Other pulmonary embolism without acute cor pulmonale: Secondary | ICD-10-CM

## 2023-04-13 LAB — CBC WITH DIFFERENTIAL (CANCER CENTER ONLY)
Abs Immature Granulocytes: 0.01 10*3/uL (ref 0.00–0.07)
Basophils Absolute: 0 10*3/uL (ref 0.0–0.1)
Basophils Relative: 0 %
Eosinophils Absolute: 0.1 10*3/uL (ref 0.0–0.5)
Eosinophils Relative: 2 %
HCT: 35 % — ABNORMAL LOW (ref 36.0–46.0)
Hemoglobin: 12.1 g/dL (ref 12.0–15.0)
Immature Granulocytes: 0 %
Lymphocytes Relative: 37 %
Lymphs Abs: 2.3 10*3/uL (ref 0.7–4.0)
MCH: 25.4 pg — ABNORMAL LOW (ref 26.0–34.0)
MCHC: 34.6 g/dL (ref 30.0–36.0)
MCV: 73.4 fL — ABNORMAL LOW (ref 80.0–100.0)
Monocytes Absolute: 0.5 10*3/uL (ref 0.1–1.0)
Monocytes Relative: 8 %
Neutro Abs: 3.2 10*3/uL (ref 1.7–7.7)
Neutrophils Relative %: 53 %
Platelet Count: 216 10*3/uL (ref 150–400)
RBC: 4.77 MIL/uL (ref 3.87–5.11)
RDW: 17.3 % — ABNORMAL HIGH (ref 11.5–15.5)
WBC Count: 6.1 10*3/uL (ref 4.0–10.5)
nRBC: 0 % (ref 0.0–0.2)

## 2023-04-13 LAB — IRON AND TIBC
Iron: 72 ug/dL (ref 28–170)
Saturation Ratios: 21 % (ref 10.4–31.8)
TIBC: 336 ug/dL (ref 250–450)
UIBC: 264 ug/dL

## 2023-04-13 LAB — CMP (CANCER CENTER ONLY)
ALT: 14 U/L (ref 0–44)
AST: 23 U/L (ref 15–41)
Albumin: 3.8 g/dL (ref 3.5–5.0)
Alkaline Phosphatase: 81 U/L (ref 38–126)
Anion gap: 7 (ref 5–15)
BUN: 11 mg/dL (ref 6–20)
CO2: 25 mmol/L (ref 22–32)
Calcium: 9 mg/dL (ref 8.9–10.3)
Chloride: 104 mmol/L (ref 98–111)
Creatinine: 0.69 mg/dL (ref 0.44–1.00)
GFR, Estimated: >60 mL/min (ref >60)
Glucose, Bld: 97 mg/dL (ref 70–99)
Potassium: 3.8 mmol/L (ref 3.5–5.1)
Sodium: 136 mmol/L (ref 135–145)
Total Bilirubin: 0.6 mg/dL (ref 0.3–1.2)
Total Protein: 7.3 g/dL (ref 6.5–8.1)

## 2023-04-13 LAB — FERRITIN: Ferritin: 14 ng/mL (ref 11–307)

## 2023-04-15 ENCOUNTER — Inpatient Hospital Stay: Payer: BC Managed Care – PPO

## 2023-04-15 ENCOUNTER — Encounter: Payer: Self-pay | Admitting: Oncology

## 2023-04-15 ENCOUNTER — Inpatient Hospital Stay: Payer: BC Managed Care – PPO | Admitting: Oncology

## 2023-04-15 VITALS — BP 124/79 | HR 73 | Temp 97.3°F | Resp 18 | Wt 199.6 lb

## 2023-04-15 DIAGNOSIS — D582 Other hemoglobinopathies: Secondary | ICD-10-CM

## 2023-04-15 DIAGNOSIS — I2699 Other pulmonary embolism without acute cor pulmonale: Secondary | ICD-10-CM

## 2023-04-15 DIAGNOSIS — D509 Iron deficiency anemia, unspecified: Secondary | ICD-10-CM | POA: Diagnosis not present

## 2023-04-15 DIAGNOSIS — D500 Iron deficiency anemia secondary to blood loss (chronic): Secondary | ICD-10-CM | POA: Diagnosis not present

## 2023-04-15 NOTE — Progress Notes (Signed)
Hematology/Oncology Consult note [phone] [phone]         Patient Care Team: Wilfrid Lund, PA as PCP - General (Family Medicine) Rickard Patience, MD as Consulting Physician (Oncology)  CHIEF COMPLAINTS/REASON FOR VISIT:  Follow-up for pulmonary embolism  ASSESSMENT & PLAN:   Pulmonary embolus (HCC) unprovoked small pulmonary embolism Patient has finished 3+ months of Eliquis 5 mg twice daily.   Clinically she is doing very well, Hypercoagulable workup is negative including negative antiphospholipid syndrome, negative factor V Leiden mutation, normal protein C/S levels, negative beta glycoprotein antibodies, negative prothrombin gene mutation  Recommend long term Eliquis 2.5 mg twice daily for  anticoagulation prophylaxis. Patient expresses her desire of not wanting long-term Eliquis, she agrees in continue Eliquis 2.5mg  BID for another 3 months, and after that she prefers to switch to Aspirin for prophylaxis.   We will rediscuss at the next visit again.   Hemoglobin C trait (HCC) Chronic microcytosis.   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 Suggest prenatal counseling if she is planning to start a family.  Iron deficiency anemia Labs are reviewed and discussed with patient. Lab Results  Component Value Date   HGB 12.1 04/13/2023   TIBC 336 04/13/2023   IRONPCTSAT 21 04/13/2023   FERRITIN 14 04/13/2023     Ferrinin has improved. Recommend patient to take iron supplementation once daily for another 2 months and then stop  Orders Placed This Encounter  Procedures   CBC with Differential/Platelet    Standing Status:   Future    Standing Expiration Date:   04/14/2024   Iron and TIBC    Standing Status:   Future    Standing Expiration Date:   04/14/2024   Ferritin    Standing Status:   Future    Standing Expiration Date:   10/14/2023   Follow-up in 3 months. All questions were answered. The patient knows to call  the clinic with any problems, questions or concerns.  Rickard Patience, MD, PhD [facility] Hematology Oncology 04/15/2023   HISTORY OF PRESENTING ILLNESS:   Lisa Bray is a  32 y.o.  female with PMH listed below was seen in consultation at the request of  Wilfrid Lund, [place]  for evaluation of pulmonary embolism  03/01/2022, patient felt left upper back discomfort to feel like pulled muscle.  She went to emergency room for further evaluation.  Denies any cough, fever or chills. Chest x-ray showed airspace opacity in the lingula may reflect infiltrate. CT angio chest PE protocol showed small pulmonary emboli within the lingula, associated groundglass of airspace opacity in the lingula.  Small left pleural effusion.  03/02/2022, bilateral lower extremity ultrasound negative for DVT.  Patient was started on blood thinner with Eliquis and empiric azithromycin and discharged home.  She went back to emergency room on 03/07/2022, for evaluation of recurrent pain which was significant as severe but from left collarbone down to her chest.  03/07/2022 repeat CT chest angiogram showed tiny nonocclusive pulmonary embolism in a lingular branch of the left pulmonary artery. Probable additional peripheral embolus in the lingula, similar to prior. No new or progressive pulmonary embolic disease evident.  Interval evolution of the lingular infarct, now with more consolidative appearance although size is similar to previous. Interval progression of left pleural effusion, now moderate in size.  Patient was seen by pulmonology for left pleural effusion, patient had point of focus ultrasound done which was reassuring the left pleural effusion mostly resolved.  Only trace left.  Patient had negative antiphospholipid antibodies, ANA was positive-SSA antibody positive.  She has been referred to both hematology and rheumatology for further evaluation.  Patient denies previous personal history of thrombosis or family history  of thrombosis.  Denies any family history of cancer.  Denies any OCP use, recent immobilization events prior to diagnosis of PE.  Patient had positive ANA and was evaluated by rheumatology Dr. Allena Katz who feels that her clinical history is suspicious for emerging systemic connective tissue disease although currently she is asymptomatic, she has no symptoms for Sjogren syndrome lupus.  Dr. Allena Katz recommend long-term watchful waiting.-If she becomes pregnant in the future, Dr. Allena Katz recommend her to stop Plaquenil to reduce risk of neonatal lupus.  INTERVAL HISTORY Lisa Bray is a 32 y.o. female who has above history reviewed by me today presents for follow up visit for history of acute unprovoked pulmonary embolism. Patient reports feeling well.  She takes Eliquis 2.5 mg twice daily Denies any bleeding events.  Menstrual period is heavy.   She took oral iron supplementation for 1 month.    MEDICAL HISTORY:  Past Medical History:  Diagnosis Date   Neurogenic syncope    Recurrent upper respiratory infection (URI)     SURGICAL HISTORY: Past Surgical History:  Procedure Laterality Date   BREAST BIOPSY Right     SOCIAL HISTORY: Social History   Socioeconomic History   Marital status: Single    Spouse name: Not on file   Number of children: 0   Years of education: BS   Highest education level: Not on file  Occupational History   Occupation: [employer]  Tobacco Use   Smoking status: Never   Smokeless tobacco: Never  Vaping Use   Vaping status: Never Used  Substance and Sexual Activity   Alcohol use: No   Drug use: No   Sexual activity: Not on file  Other Topics Concern   Not on file  Social History Narrative   Lives at home, single   Right-handed   No caffeine   Social Determinants of Health   Financial Resource Strain: Not on file  Food Insecurity: Not on file  Transportation Needs: Not on file  Physical Activity: Not on file  Stress: Not on file  Social  Connections: Unknown (10/31/2021)   Received [facility], [facility]   Social Network    Social Network: Not on file  Intimate Partner Violence: Unknown (09/24/2021)   Received from [facility], [facility]   HITS    Physically Hurt: Not on file    Insult or Talk Down To: Not on file    Threaten Physical Harm: Not on file    Scream or Curse: Not on file    FAMILY HISTORY: Family History  Problem Relation Age of Onset   Hypertension Father    Glaucoma Father    Migraines Neg Hx     ALLERGIES:  has No Known Allergies.  MEDICATIONS:  Current Outpatient Medications  Medication Sig Dispense Refill   apixaban (ELIQUIS) 2.5 MG TABS tablet Take 1 tablet (2.5 mg total) by mouth 2 (two) times daily. 180 tablet 1   benzoyl peroxide 5 % gel Apply topically 2 (two) times daily.     ferrous sulfate 325 (65 FE) MG EC tablet Take 1 tablet (325 mg total) by mouth 2 (two) times daily with a meal. 180 tablet 0   hydroquinone 4 % cream Apply topically at bedtime.     Salicylic Acid (  DERMACINRX ATRIX ANTIBAC WASH) 2 % LIQD Lather this product on face and wash off thoroughly every morning and evening.     tretinoin (RETIN-A) 0.025 % cream Apply topically at bedtime.     No current facility-administered medications for this visit.    Review of Systems  Constitutional:  Positive for fatigue. Negative for appetite change, chills and fever.  HENT:   Negative for hearing loss and voice change.   Eyes:  Negative for eye problems.  Respiratory:  Negative for chest tightness and cough.   Cardiovascular:  Negative for chest pain.  Gastrointestinal:  Negative for abdominal distention, abdominal pain and blood in stool.  Endocrine: Negative for hot flashes.  Genitourinary:  Negative for difficulty urinating and frequency.   Musculoskeletal:  Negative for arthralgias.  Skin:  Negative for itching and rash.  Neurological:  Positive for light-headedness. Negative for extremity weakness.   Hematological:  Negative for adenopathy.  Psychiatric/Behavioral:  Negative for confusion.    PHYSICAL EXAMINATION: ECOG PERFORMANCE STATUS: 0 - Asymptomatic Vitals:   04/15/23 1358  BP: 124/79  Pulse: 73  Resp: 18  Temp: (!) 97.3 F (36.3 C)   Filed Weights   04/15/23 1358  Weight: 199 lb 9.6 oz (90.5 kg)    Physical Exam Constitutional:      General: She is not in acute distress. HENT:     Head: Normocephalic and atraumatic.  Eyes:     General: No scleral icterus. Cardiovascular:     Rate and Rhythm: Normal rate and regular rhythm.     Heart sounds: Normal heart sounds.  Pulmonary:     Effort: Pulmonary effort is normal. No respiratory distress.     Breath sounds: No wheezing.  Abdominal:     General: Bowel sounds are normal. There is no distension.     Palpations: Abdomen is soft.  Musculoskeletal:        General: No deformity. Normal range of motion.     Cervical back: Normal range of motion and neck supple.  Skin:    General: Skin is warm and dry.     Findings: No erythema or rash.  Neurological:     Mental Status: She is alert and oriented to person, place, and time. Mental status is at baseline.     Cranial Nerves: No cranial nerve deficit.     Coordination: Coordination normal.  Psychiatric:        Mood and Affect: Mood normal.     LABORATORY DATA:  I have reviewed the data as listed    Latest Ref Rng & Units 04/13/2023   10:18 AM 01/01/2023   10:00 AM 11/28/2022   12:21 AM  CBC  WBC 4.0 - 10.5 K/uL 6.1  6.5  8.2   Hemoglobin 12.0 - 15.0 g/dL 30.8  65.7  84.6   Hematocrit 36.0 - 46.0 % 35.0  31.4  34.6   Platelets 150 - 400 K/uL 216  235  247       Latest Ref Rng & Units 04/13/2023   10:18 AM 01/01/2023   10:00 AM 11/28/2022   12:21 AM  CMP  Glucose 70 - 99 mg/dL 97  91  87   BUN 6 - 20 mg/dL 11  8  12    Creatinine 0.44 - 1.00 mg/dL 9.62  9.52  8.41   Sodium 135 - 145 mmol/L 136  136  136   Potassium 3.5 - 5.1 mmol/L 3.8  3.8  4.0    Chloride 98 - 111  mmol/L 104  105  101   CO2 22 - 32 mmol/L 25  24  25    Calcium 8.9 - 10.3 mg/dL 9.0  8.4  9.2   Total Protein 6.5 - 8.1 g/dL 7.3  7.2  7.6   Total Bilirubin 0.3 - 1.2 mg/dL 0.6  0.3  0.4   Alkaline Phos 38 - 126 U/L 81  73  73   AST 15 - 41 U/L 23  20  26    ALT 0 - 44 U/L 14  13  16        RADIOGRAPHIC STUDIES: I have personally reviewed the radiological images as listed and agreed with the findings in the report. No results found.

## 2023-04-15 NOTE — Progress Notes (Signed)
Pt here for follow up. Pt reports that she has only been taking iron or 1 month.

## 2023-04-15 NOTE — Assessment & Plan Note (Signed)
Labs are reviewed and discussed with patient. Lab Results  Component Value Date   HGB 12.1 04/13/2023   TIBC 336 04/13/2023   IRONPCTSAT 21 04/13/2023   FERRITIN 14 04/13/2023     Ferrinin has improved. Recommend patient to take iron supplementation once daily for another 2 months and then stop

## 2023-04-15 NOTE — Assessment & Plan Note (Addendum)
unprovoked small pulmonary embolism Patient has finished 3+ months of Eliquis 5 mg twice daily.   Clinically she is doing very well, Hypercoagulable workup is negative including negative antiphospholipid syndrome, negative factor V Leiden mutation, normal protein C/S levels, negative beta glycoprotein antibodies, negative prothrombin gene mutation  Recommend long term Eliquis 2.5 mg twice daily for  anticoagulation prophylaxis. Patient expresses her desire of not wanting long-term Eliquis, she agrees in continue Eliquis 2.5mg  BID for another 3 months, and after that she prefers to switch to Aspirin for prophylaxis.   We will rediscuss at the next visit again.

## 2023-04-15 NOTE — Assessment & Plan Note (Signed)
Chronic microcytosis.   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 Suggest prenatal counseling if she is planning to start a family.

## 2023-04-30 ENCOUNTER — Encounter: Payer: Self-pay | Admitting: Oncology

## 2023-07-09 ENCOUNTER — Encounter: Payer: Self-pay | Admitting: Oncology

## 2023-07-09 ENCOUNTER — Other Ambulatory Visit: Payer: Self-pay

## 2023-07-09 ENCOUNTER — Inpatient Hospital Stay: Payer: BC Managed Care – PPO | Admitting: Oncology

## 2023-07-09 ENCOUNTER — Inpatient Hospital Stay: Payer: BC Managed Care – PPO | Attending: Oncology

## 2023-07-09 VITALS — BP 119/75 | HR 77 | Temp 98.1°F | Resp 18 | Wt 206.4 lb

## 2023-07-09 DIAGNOSIS — I2699 Other pulmonary embolism without acute cor pulmonale: Secondary | ICD-10-CM

## 2023-07-09 DIAGNOSIS — R768 Other specified abnormal immunological findings in serum: Secondary | ICD-10-CM

## 2023-07-09 DIAGNOSIS — D582 Other hemoglobinopathies: Secondary | ICD-10-CM

## 2023-07-09 DIAGNOSIS — D500 Iron deficiency anemia secondary to blood loss (chronic): Secondary | ICD-10-CM | POA: Diagnosis not present

## 2023-07-09 DIAGNOSIS — Z86711 Personal history of pulmonary embolism: Secondary | ICD-10-CM | POA: Diagnosis not present

## 2023-07-09 DIAGNOSIS — D509 Iron deficiency anemia, unspecified: Secondary | ICD-10-CM | POA: Diagnosis present

## 2023-07-09 LAB — CBC WITH DIFFERENTIAL/PLATELET
Abs Immature Granulocytes: 0.03 10*3/uL (ref 0.00–0.07)
Basophils Absolute: 0 10*3/uL (ref 0.0–0.1)
Basophils Relative: 0 %
Eosinophils Absolute: 0.1 10*3/uL (ref 0.0–0.5)
Eosinophils Relative: 2 %
HCT: 35 % — ABNORMAL LOW (ref 36.0–46.0)
Hemoglobin: 12.2 g/dL (ref 12.0–15.0)
Immature Granulocytes: 0 %
Lymphocytes Relative: 32 %
Lymphs Abs: 2.2 10*3/uL (ref 0.7–4.0)
MCH: 26.2 pg (ref 26.0–34.0)
MCHC: 34.9 g/dL (ref 30.0–36.0)
MCV: 75.3 fL — ABNORMAL LOW (ref 80.0–100.0)
Monocytes Absolute: 0.6 10*3/uL (ref 0.1–1.0)
Monocytes Relative: 9 %
Neutro Abs: 3.8 10*3/uL (ref 1.7–7.7)
Neutrophils Relative %: 57 %
Platelets: 223 10*3/uL (ref 150–400)
RBC: 4.65 MIL/uL (ref 3.87–5.11)
RDW: 14.6 % (ref 11.5–15.5)
WBC: 6.8 10*3/uL (ref 4.0–10.5)
nRBC: 0 % (ref 0.0–0.2)

## 2023-07-09 LAB — CMP (CANCER CENTER ONLY)
ALT: 14 U/L (ref 0–44)
AST: 21 U/L (ref 15–41)
Albumin: 3.7 g/dL (ref 3.5–5.0)
Alkaline Phosphatase: 77 U/L (ref 38–126)
Anion gap: 8 (ref 5–15)
BUN: 7 mg/dL (ref 6–20)
CO2: 24 mmol/L (ref 22–32)
Calcium: 9 mg/dL (ref 8.9–10.3)
Chloride: 103 mmol/L (ref 98–111)
Creatinine: 0.67 mg/dL (ref 0.44–1.00)
GFR, Estimated: >60 mL/min (ref >60)
Glucose, Bld: 84 mg/dL (ref 70–99)
Potassium: 4.5 mmol/L (ref 3.5–5.1)
Sodium: 135 mmol/L (ref 135–145)
Total Bilirubin: 0.5 mg/dL (ref 0.0–1.2)
Total Protein: 7.3 g/dL (ref 6.5–8.1)

## 2023-07-09 LAB — IRON AND TIBC
Iron: 57 ug/dL (ref 28–170)
Saturation Ratios: 17 % (ref 10.4–31.8)
TIBC: 346 ug/dL (ref 250–450)
UIBC: 289 ug/dL

## 2023-07-09 LAB — FERRITIN: Ferritin: 15 ng/mL (ref 11–307)

## 2023-07-09 MED ORDER — APIXABAN 2.5 MG PO TABS: 2.5000 mg | ORAL_TABLET | Freq: Two times a day (BID) | ORAL | 1 refills | Status: DC

## 2023-07-09 NOTE — Assessment & Plan Note (Addendum)
Labs are reviewed and discussed with patient. Lab Results  Component Value Date   HGB 12.2 07/09/2023   TIBC 346 07/09/2023   IRONPCTSAT 17 07/09/2023   FERRITIN 15 07/09/2023     Ferrinin has improved. Given that she has heavy menstrual period, recommend patient to take iron supplementation once daily

## 2023-07-09 NOTE — Assessment & Plan Note (Signed)
Possible emerging connective tissue disease however not meeting indication of starting medication.  Rheumatology recommend watchful waiting.

## 2023-07-09 NOTE — Assessment & Plan Note (Addendum)
unprovoked small pulmonary embolism Patient has finished 3+ months of Eliquis 5 mg twice daily.   Clinically she is doing very well, Hypercoagulable workup is negative including negative antiphospholipid syndrome, negative factor V Leiden mutation, normal protein C/S levels, negative beta glycoprotein antibodies, negative prothrombin gene mutation  Recommend long term Eliquis 2.5 mg twice daily for  anticoagulation prophylaxis. Patient expresses her desire of not wanting long-term Eliquis, she plans to continue Eliquis 2.5mg  BID until August 2025 We will rediscuss at the next visit again.

## 2023-07-09 NOTE — Progress Notes (Signed)
Hematology/Oncology Consult note [phone] [phone]         Patient Care Team: Wilfrid Lund, PA as PCP - General (Family Medicine) Rickard Patience, MD as Consulting Physician (Oncology)  CHIEF COMPLAINTS/REASON FOR VISIT:  Follow-up for pulmonary embolism  ASSESSMENT & PLAN:   Pulmonary embolus (HCC) unprovoked small pulmonary embolism Patient has finished 3+ months of Eliquis 5 mg twice daily.   Clinically she is doing very well, Hypercoagulable workup is negative including negative antiphospholipid syndrome, negative factor V Leiden mutation, normal protein C/S levels, negative beta glycoprotein antibodies, negative prothrombin gene mutation  Recommend long term Eliquis 2.5 mg twice daily for  anticoagulation prophylaxis. Patient expresses her desire of not wanting long-term Eliquis, she plans to continue Eliquis 2.5mg  BID until August 2025 We will rediscuss at the next visit again.   Iron deficiency anemia Labs are reviewed and discussed with patient. Lab Results  Component Value Date   HGB 12.2 07/09/2023   TIBC 346 07/09/2023   IRONPCTSAT 17 07/09/2023   FERRITIN 15 07/09/2023     Ferrinin has improved. Given that she has heavy menstrual period, recommend patient to take iron supplementation once daily  ANA positive Possible emerging connective tissue disease however not meeting indication of starting medication.  Rheumatology recommend watchful waiting.  Orders Placed This Encounter  Procedures   CBC with Differential (Cancer Center Only)    Standing Status:   Future    Expected Date:   01/26/2024    Expiration Date:   07/08/2024   CMP (Cancer Center only)    Standing Status:   Future    Expected Date:   01/26/2024    Expiration Date:   07/08/2024   Iron and TIBC    Standing Status:   Future    Expected Date:   01/26/2024    Expiration Date:   07/08/2024   Ferritin    Standing Status:   Future    Expected Date:   01/26/2024    Expiration Date:    07/08/2024   Follow-up in 6 months. All questions were answered. The patient knows to call the clinic with any problems, questions or concerns.  Rickard Patience, MD, PhD [facility] Hematology Oncology 07/09/2023   HISTORY OF PRESENTING ILLNESS:   Lisa Bray is a  33 y.o.  female with PMH listed below was seen in consultation at the request of  Wilfrid Lund, [place]  for evaluation of pulmonary embolism  03/01/2022, patient felt left upper back discomfort to feel like pulled muscle.  She went to emergency room for further evaluation.  Denies any cough, fever or chills. Chest x-ray showed airspace opacity in the lingula may reflect infiltrate. CT angio chest PE protocol showed small pulmonary emboli within the lingula, associated groundglass of airspace opacity in the lingula.  Small left pleural effusion.  03/02/2022, bilateral lower extremity ultrasound negative for DVT.  Patient was started on blood thinner with Eliquis and empiric azithromycin and discharged home.  She went back to emergency room on 03/07/2022, for evaluation of recurrent pain which was significant as severe but from left collarbone down to her chest.  03/07/2022 repeat CT chest angiogram showed tiny nonocclusive pulmonary embolism in a lingular branch of the left pulmonary artery. Probable additional peripheral embolus in the lingula, similar to prior. No new or progressive pulmonary embolic disease evident.  Interval evolution of the lingular infarct, now with more consolidative appearance although size is similar to previous. Interval progression of left pleural effusion,  now moderate in size.  Patient was seen by pulmonology for left pleural effusion, patient had point of focus ultrasound done which was reassuring the left pleural effusion mostly resolved.  Only trace left.  Patient had negative antiphospholipid antibodies, ANA was positive-SSA antibody positive.  She has been referred to both hematology and rheumatology for  further evaluation.  Patient denies previous personal history of thrombosis or family history of thrombosis.  Denies any family history of cancer.  Denies any OCP use, recent immobilization events prior to diagnosis of PE.  Patient had positive ANA and was evaluated by rheumatology Dr. Allena Katz who feels that her clinical history is suspicious for emerging systemic connective tissue disease although currently she is asymptomatic, she has no symptoms for Sjogren syndrome lupus.  Dr. Allena Katz recommend long-term watchful waiting.-If she becomes pregnant in the future, Dr. Allena Katz recommend her to stop Plaquenil to reduce risk of neonatal lupus.  INTERVAL HISTORY Lisa Bray is a 33 y.o. female who has above history reviewed by me today presents for follow up visit for history of acute unprovoked pulmonary embolism. Patient reports feeling well.  She takes Eliquis 2.5 mg twice daily Denies any bleeding events.  Menstrual period is heavy.   She took oral iron supplementation     MEDICAL HISTORY:  Past Medical History:  Diagnosis Date   Neurogenic syncope    Recurrent upper respiratory infection (URI)     SURGICAL HISTORY: Past Surgical History:  Procedure Laterality Date   BREAST BIOPSY Right     SOCIAL HISTORY: Social History   Socioeconomic History   Marital status: Single    Spouse name: Not on file   Number of children: 0   Years of education: BS   Highest education level: Not on file  Occupational History   Occupation: [employer]  Tobacco Use   Smoking status: Never   Smokeless tobacco: Never  Vaping Use   Vaping status: Never Used  Substance and Sexual Activity   Alcohol use: No   Drug use: No   Sexual activity: Not on file  Other Topics Concern   Not on file  Social History Narrative   Lives at home, single   Right-handed   No caffeine   Social Drivers of Corporate investment banker Strain: Not on file  Food Insecurity: Not on file  Transportation Needs: Not  on file  Physical Activity: Not on file  Stress: Not on file  Social Connections: Unknown (10/31/2021)   Received from [facility], [facility]   Social Network    Social Network: Not on file  Intimate Partner Violence: Unknown (09/24/2021)   Received from [facility], [facility]   HITS    Physically Hurt: Not on file    Insult or Talk Down To: Not on file    Threaten Physical Harm: Not on file    Scream or Curse: Not on file    FAMILY HISTORY: Family History  Problem Relation Age of Onset   Hypertension Father    Glaucoma Father    Migraines Neg Hx     ALLERGIES:  has no known allergies.  MEDICATIONS:  Current Outpatient Medications  Medication Sig Dispense Refill   benzoyl peroxide 5 % gel Apply topically 2 (two) times daily.     ferrous sulfate 325 (65 FE) MG EC tablet Take 1 tablet (325 mg total) by mouth 2 (two) times daily with a meal. 180 tablet 0   hydroquinone 4 % cream Apply topically at bedtime.  Salicylic Acid (DERMACINRX ATRIX ANTIBAC WASH) 2 % LIQD Lather this product on face and wash off thoroughly every morning and evening.     tretinoin (RETIN-A) 0.025 % cream Apply topically at bedtime.     apixaban (ELIQUIS) 2.5 MG TABS tablet Take 1 tablet (2.5 mg total) by mouth 2 (two) times daily. 180 tablet 1   No current facility-administered medications for this visit.    Review of Systems  Constitutional:  Positive for fatigue. Negative for appetite change, chills and fever.  HENT:   Negative for hearing loss and voice change.   Eyes:  Negative for eye problems.  Respiratory:  Negative for chest tightness and cough.   Cardiovascular:  Negative for chest pain.  Gastrointestinal:  Negative for abdominal distention, abdominal pain and blood in stool.  Endocrine: Negative for hot flashes.  Genitourinary:  Negative for difficulty urinating and frequency.   Musculoskeletal:  Negative for arthralgias.  Skin:  Negative for itching and rash.  Neurological:   Positive for light-headedness. Negative for extremity weakness.  Hematological:  Negative for adenopathy.  Psychiatric/Behavioral:  Negative for confusion.    PHYSICAL EXAMINATION: ECOG PERFORMANCE STATUS: 0 - Asymptomatic Vitals:   07/09/23 1022  BP: 119/75  Pulse: 77  Resp: 18  Temp: 98.1 F (36.7 C)  SpO2: 100%   Filed Weights   07/09/23 1022  Weight: 206 lb 6.4 oz (93.6 kg)    Physical Exam Constitutional:      General: She is not in acute distress. HENT:     Head: Normocephalic and atraumatic.  Eyes:     General: No scleral icterus. Cardiovascular:     Rate and Rhythm: Normal rate and regular rhythm.  Pulmonary:     Effort: Pulmonary effort is normal. No respiratory distress.     Breath sounds: No wheezing.  Abdominal:     General: Bowel sounds are normal. There is no distension.     Palpations: Abdomen is soft.  Musculoskeletal:        General: No deformity. Normal range of motion.     Cervical back: Normal range of motion and neck supple.  Skin:    Findings: No erythema or rash.  Neurological:     Mental Status: She is alert and oriented to person, place, and time. Mental status is at baseline.  Psychiatric:        Mood and Affect: Mood normal.     LABORATORY DATA:  I have reviewed the data as listed    Latest Ref Rng & Units 07/09/2023    9:47 AM 04/13/2023   10:18 AM 01/01/2023   10:00 AM  CBC  WBC 4.0 - 10.5 K/uL 6.8  6.1  6.5   Hemoglobin 12.0 - 15.0 g/dL 29.5  62.1  30.8   Hematocrit 36.0 - 46.0 % 35.0  35.0  31.4   Platelets 150 - 400 K/uL 223  216  235       Latest Ref Rng & Units 07/09/2023    9:47 AM 04/13/2023   10:18 AM 01/01/2023   10:00 AM  CMP  Glucose 70 - 99 mg/dL 84  97  91   BUN 6 - 20 mg/dL 7  11  8    Creatinine 0.44 - 1.00 mg/dL 6.57  8.46  9.62   Sodium 135 - 145 mmol/L 135  136  136   Potassium 3.5 - 5.1 mmol/L 4.5  3.8  3.8   Chloride 98 - 111 mmol/L 103  104  105  CO2 22 - 32 mmol/L 24  25  24    Calcium 8.9 - 10.3  mg/dL 9.0  9.0  8.4   Total Protein 6.5 - 8.1 g/dL 7.3  7.3  7.2   Total Bilirubin 0.0 - 1.2 mg/dL 0.5  0.6  0.3   Alkaline Phos 38 - 126 U/L 77  81  73   AST 15 - 41 U/L 21  23  20    ALT 0 - 44 U/L 14  14  13        RADIOGRAPHIC STUDIES: I have personally reviewed the radiological images as listed and agreed with the findings in the report. No results found.

## 2023-09-01 ENCOUNTER — Encounter: Payer: Self-pay | Admitting: Oncology

## 2023-10-22 IMAGING — CR DG SHOULDER 2+V*L*
1 series · 3 of 3 positions shown · non-contrast
Comparison: None.

CLINICAL DATA: Shoulder pain after fall

EXAM:
LEFT SHOULDER - 2+ VIEW

[Series 1: dg shoulder left · 0.14mm/px · 3 of 3 slices shown]
[im 1/3]
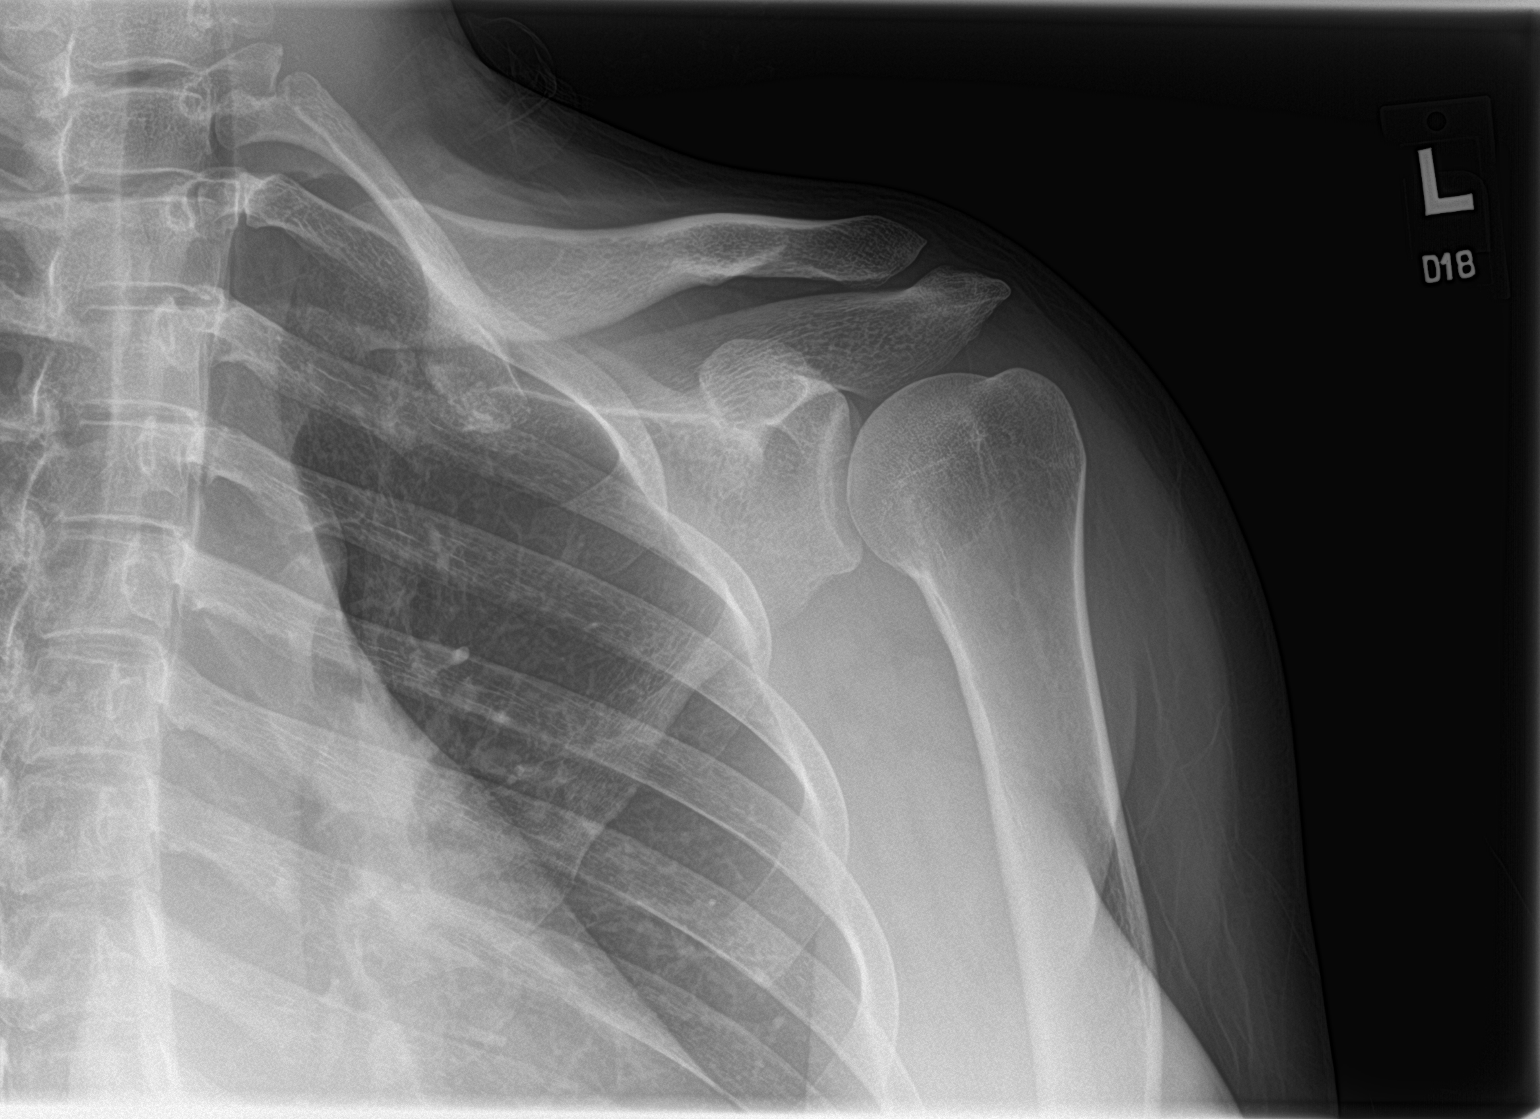
[im 2/3]
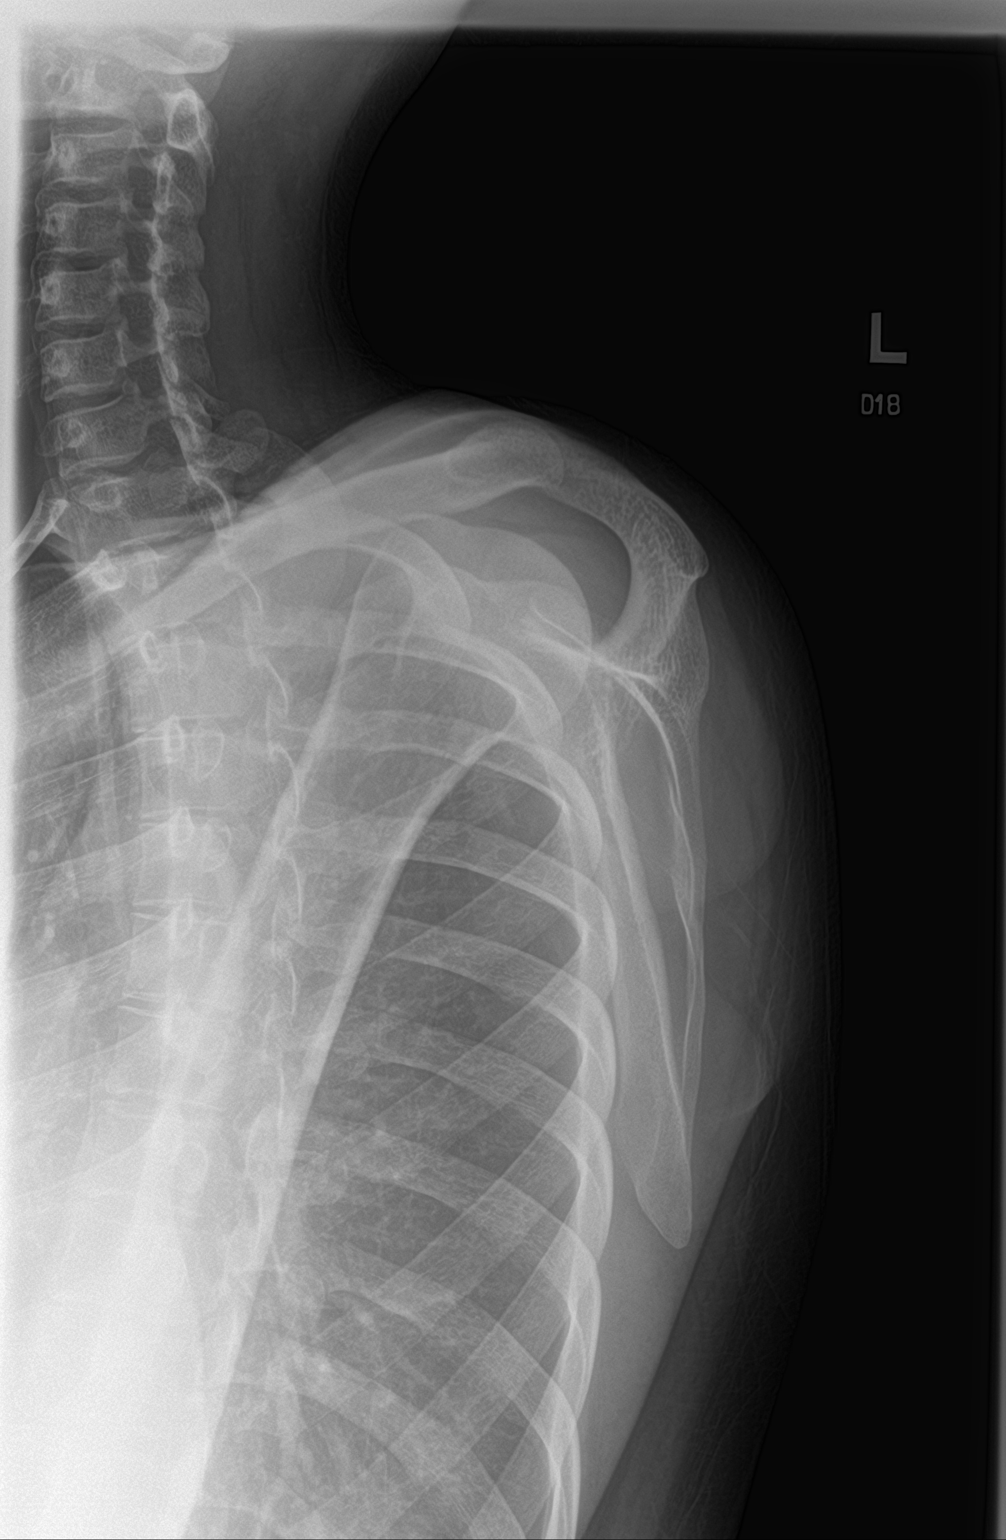
[im 3/3]
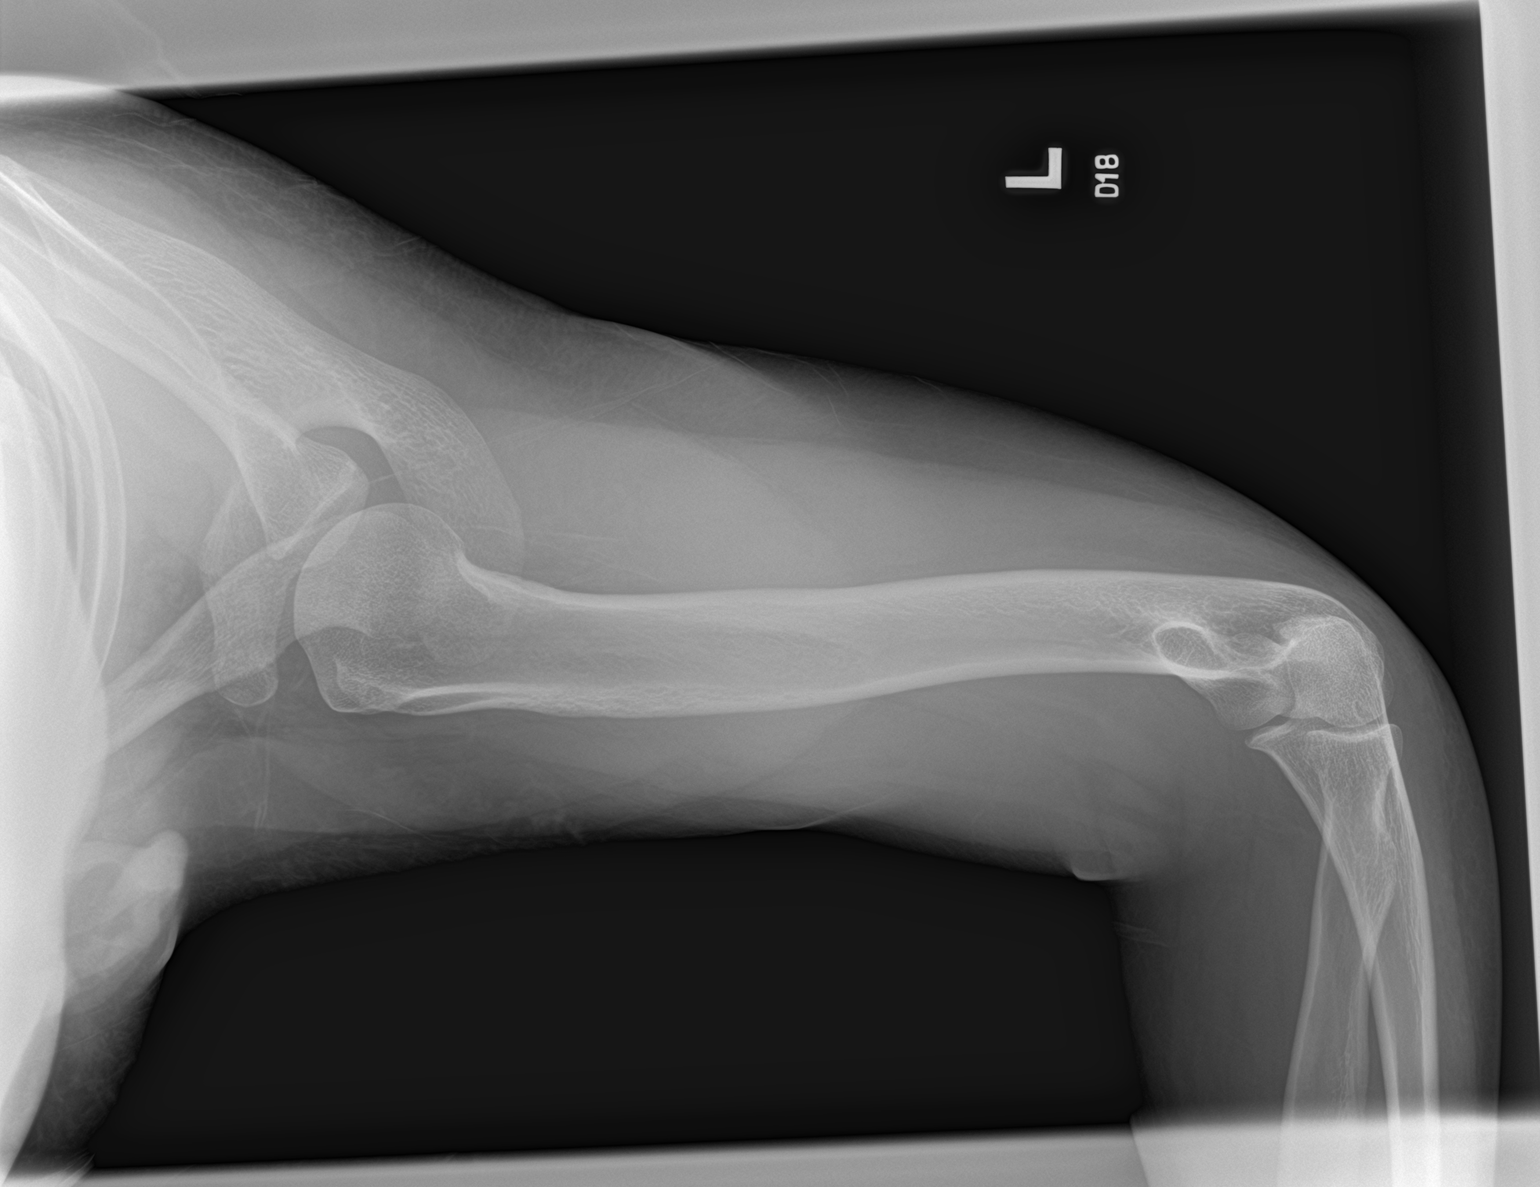

[3 of 3 positions shown; findings below may reference images not displayed]

FINDINGS: There is no evidence of fracture or dislocation. There is no
evidence of arthropathy or other focal bone abnormality. Soft
tissues are unremarkable. Visualized lung fields are clear.
IMPRESSION: No acute osseous abnormality.

## 2023-12-30 ENCOUNTER — Ambulatory Visit: Admitting: Student in an Organized Health Care Education/Training Program

## 2024-01-05 ENCOUNTER — Encounter: Payer: Self-pay | Admitting: Student in an Organized Health Care Education/Training Program

## 2024-01-05 ENCOUNTER — Ambulatory Visit: Admitting: Student in an Organized Health Care Education/Training Program

## 2024-01-05 VITALS — BP 90/60 | HR 75 | Temp 98.4°F | Ht 66.0 in | Wt 208.0 lb

## 2024-01-05 DIAGNOSIS — J90 Pleural effusion, not elsewhere classified: Secondary | ICD-10-CM

## 2024-01-05 DIAGNOSIS — R768 Other specified abnormal immunological findings in serum: Secondary | ICD-10-CM | POA: Diagnosis not present

## 2024-01-05 DIAGNOSIS — I2699 Other pulmonary embolism without acute cor pulmonale: Secondary | ICD-10-CM

## 2024-01-05 NOTE — Progress Notes (Signed)
 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 Tibbs is presenting for follow up for unprovoked pulmonary embolism with a left sided pleural effusion. On my point of care ultrasound evaluation of the pleural effusion (9/16 & 9/20), the fluid appeared to be free flowing and simple appearing. Today, repeat POCUS shows no pleural effusion. She's also had an MRI of the chest March of 2024 that also showed no pleural effusion.   Workup for cause behind the PE has included hyper-coag workup that was negative. She does have a positive SSA (ro) antibody but with negative ANA. She has been switched over to Eliquis  2.5 mg bid and she plans on discontinuing it after two years of treatment in consultation with hematology.   The source of her pleural effusion at this point is unclear. The differentials include an effusion associated with pulmonary embolism, or an inflammatory exudate. It is possible that she had a pulmonary embolism resulting in lung infarction (noted on CT) followed by the development of a pleural effusion. It is also possible she developed pleuritis as the initial presentation of an auto-immune disorder resulting in the development of a pleural effusion triggering a pulmonary embolus.  Today, we did discuss symptoms that could suggest recurrence of PE that she should monitor for after discontinuation of her anti-coagulation. Given there is no recurrence of effusion, there will be no need for further follow up.  Return if symptoms worsen or fail to improve.  I spent 30 minutes caring for this patient today, including preparing to see the patient, obtaining a medical history , reviewing a separately obtained history, performing a medically appropriate examination and/or evaluation, counseling and educating the patient/family/caregiver, documenting clinical information in the electronic health record, and independently  interpreting results (not separately reported/billed) and communicating results to the patient/family/caregiver  Belva November, MD [facility]   End of visit medications:  No orders of the defined types were placed in this encounter.    Current Outpatient Medications:    apixaban  (ELIQUIS ) 5 MG TABS tablet, Take 5 mg by mouth., Disp: , Rfl:    benzoyl peroxide 5 % gel, Apply topically 2 (two) times daily., Disp: , Rfl:    ferrous sulfate  325 (65 FE) MG EC tablet, Take 1 tablet (325 mg total) by mouth 2 (two) times daily with a meal., Disp: 180 tablet, Rfl: 0   fluticasone  (FLONASE ) 50 MCG/ACT nasal spray, Place 2 sprays into both nostrils daily., Disp: , Rfl:    hydroquinone 4 % cream, Apply topically at bedtime., Disp: , Rfl:    Salicylic Acid (DERMACINRX ATRIX ANTIBAC WASH) 2 % LIQD, Lather this product on face and wash off thoroughly every morning and evening., Disp: , Rfl:    tretinoin (RETIN-A) 0.025 % cream, Apply topically at bedtime., Disp: , Rfl:    tretinoin microspheres (RETIN-A MICRO) 0.1 % gel, Apply topically at bedtime., Disp: , Rfl:    Subjective:   PATIENT ID: Squire Glen Vicenta GENDER: female DOB: June 30, 1990, MRN: [id number]  Chief Complaint  Patient presents with   Follow-up    History of pleural effusion and PE  Breathing has been ok but can occasionally have side pain here and there but feels fine otherwise.    HPI  Ms. Drummonds is a 33 year old female with a diagnosis of unprovoked PE and associated small left sided pleural effusion who is presenting to clinic today for follow up.  She was first seen in the ED on 03/01/2022 for chest pain and worked up with a CT chest with PE protocol showed a left sided pleural effusion in addition to a small lingular pulmonary embolus. She was started on Apixaban  and discharged. She re-presented on 03/07/2022 with left sided shoulder pain. Repeat CT chest showed small lingular pulmonary embolus and a  growth in the size of the pleural effusion.   At the time of my initial evaluation, her symptoms were beginning to improve. She is a non-smoker, and denies vaping. She is not on OCP or other hormonal therapy. She reports no personal or family history of auto-immune disorders, and denies joint pains, rashes, raynaud's phenomena, difficulty swallowing, or personal history of blood clots. There is no family history of repeat miscarriages.   Since our last visit she's felt well. She has no symptoms. She does not have any shortness of breath, chest pain, or pleurisy. She has seen rheumatology and followed closely with hematology. She is currently on 2.5 mg of apixaban  twice daily, with plans to discontinue next month. She is not on any immune suppression.  She is a non-smoker and denies any vape use. No noticeable occupational exposures reported.  Ancillary information including prior medications, full medical/surgical/family/social histories, and PFTs (when available) are listed below and have been reviewed.   Review of Systems  Constitutional:  Negative for chills, fever, malaise/fatigue and weight loss.  Respiratory:  Negative for cough, hemoptysis and sputum production.   Cardiovascular:  Negative for chest pain, leg swelling and PND.  Gastrointestinal:  Negative for heartburn and nausea.  Neurological:  Negative for dizziness and focal weakness.     Objective:   Vitals:   01/05/24 0919  BP: 90/60  Pulse: 75  Temp: 98.4 F (36.9 C)  TempSrc: Oral  SpO2: 99%  Weight: 208 lb (94.3 kg)  Height: 5' 6 (1.676 m)   99% on RA  BMI Readings from Last 3 Encounters:  01/05/24 33.57 kg/m  07/09/23 33.31 kg/m  04/15/23 32.22 kg/m   Wt Readings from Last 3 Encounters:  01/05/24 208 lb (94.3 kg)  07/09/23 206 lb 6.4 oz (93.6 kg)  04/15/23 199 lb 9.6 oz (90.5 kg)    Physical Exam Vitals reviewed: CNA acted as Biomedical engineer. Exam conducted with a chaperone present.  Constitutional:       Appearance: She is normal weight.  HENT:     Head: Normocephalic.     Right Ear: Tympanic membrane normal.     Nose: Nose normal.     Mouth/Throat:     Mouth: Mucous membranes are moist.  Eyes:     Extraocular Movements: Extraocular movements intact.     Pupils: Pupils are equal, round, and reactive to light.  Cardiovascular:     Rate and Rhythm: Normal rate and regular rhythm.     Pulses: Normal pulses.     Heart sounds: Normal heart sounds.  Pulmonary:     Effort: Pulmonary effort is normal.     Breath sounds: No rales (left lower lung field).  Musculoskeletal:        General: Normal range of motion.     Cervical back: Normal range of motion.  Neurological:     General: No focal deficit present.     Mental Status: She is alert and oriented to person, place, and time. Mental status is at baseline.       Ancillary Information    Past Medical History:  Diagnosis Date   Neurogenic syncope  Recurrent upper respiratory infection (URI)      Family History  Problem Relation Age of Onset   Hypertension Father    Glaucoma Father    Migraines Neg Hx      Past Surgical History:  Procedure Laterality Date   BREAST BIOPSY Right     Social History   Socioeconomic History   Marital status: Single    Spouse name: Not on file   Number of children: 0   Years of education: BS   Highest education level: Not on file  Occupational History   Occupation: [employer]  Tobacco Use   Smoking status: Never   Smokeless tobacco: Never  Vaping Use   Vaping status: Never Used  Substance and Sexual Activity   Alcohol use: No   Drug use: No   Sexual activity: Not on file  Other Topics Concern   Not on file  Social History Narrative   Lives at home, single   Right-handed   No caffeine    Social Drivers of Corporate investment banker Strain: Not on file  Food Insecurity: Not on file  Transportation Needs: Not on file  Physical Activity: Not on file  Stress: Not on file   Social Connections: Unknown (10/31/2021)   Received from [employer]   Social Network    Social Network: Not on file  Intimate Partner Violence: Unknown (09/24/2021)   Received from [facility]   HITS    Physically Hurt: Not on file    Insult or Talk Down To: Not on file    Threaten Physical Harm: Not on file    Scream or Curse: Not on file     Allergies  Allergen Reactions   Sulfamethoxazole-Trimethoprim Nausea Only     CBC    Component Value Date/Time   WBC 6.8 07/09/2023 0947   RBC 4.65 07/09/2023 0947   HGB 12.2 07/09/2023 0947   HGB 12.1 04/13/2023 1018   HCT 35.0 (L) 07/09/2023 0947   PLT 223 07/09/2023 0947   PLT 216 04/13/2023 1018   MCV 75.3 (L) 07/09/2023 0947   MCH 26.2 07/09/2023 0947   MCHC 34.9 07/09/2023 0947   RDW 14.6 07/09/2023 0947   LYMPHSABS 2.2 07/09/2023 0947   MONOABS 0.6 07/09/2023 0947   EOSABS 0.1 07/09/2023 0947   BASOSABS 0.0 07/09/2023 0947    Pulmonary Functions Testing Results:     No data to display          Outpatient Medications Prior to Visit  Medication Sig Dispense Refill   apixaban  (ELIQUIS ) 5 MG TABS tablet Take 5 mg by mouth.     benzoyl peroxide 5 % gel Apply topically 2 (two) times daily.     ferrous sulfate  325 (65 FE) MG EC tablet Take 1 tablet (325 mg total) by mouth 2 (two) times daily with a meal. 180 tablet 0   fluticasone  (FLONASE ) 50 MCG/ACT nasal spray Place 2 sprays into both nostrils daily.     hydroquinone 4 % cream Apply topically at bedtime.     Salicylic Acid (DERMACINRX ATRIX ANTIBAC WASH) 2 % LIQD Lather this product on face and wash off thoroughly every morning and evening.     tretinoin (RETIN-A) 0.025 % cream Apply topically at bedtime.     tretinoin microspheres (RETIN-A MICRO) 0.1 % gel Apply topically at bedtime.     apixaban  (ELIQUIS ) 2.5 MG TABS tablet Take 1 tablet (2.5 mg total) by mouth 2 (two) times daily. 180 tablet 1   No  facility-administered medications prior to visit.

## 2024-01-12 ENCOUNTER — Other Ambulatory Visit: Payer: Self-pay | Admitting: Oncology

## 2024-01-12 ENCOUNTER — Other Ambulatory Visit: Payer: Self-pay

## 2024-01-12 ENCOUNTER — Encounter: Payer: Self-pay | Admitting: Oncology

## 2024-01-12 DIAGNOSIS — I2699 Other pulmonary embolism without acute cor pulmonale: Secondary | ICD-10-CM

## 2024-02-03 ENCOUNTER — Inpatient Hospital Stay: Payer: BC Managed Care – PPO | Attending: Oncology

## 2024-02-03 DIAGNOSIS — I2699 Other pulmonary embolism without acute cor pulmonale: Secondary | ICD-10-CM | POA: Diagnosis present

## 2024-02-03 DIAGNOSIS — D509 Iron deficiency anemia, unspecified: Secondary | ICD-10-CM | POA: Insufficient documentation

## 2024-02-03 DIAGNOSIS — R760 Raised antibody titer: Secondary | ICD-10-CM | POA: Diagnosis not present

## 2024-02-03 DIAGNOSIS — J90 Pleural effusion, not elsewhere classified: Secondary | ICD-10-CM | POA: Insufficient documentation

## 2024-02-03 LAB — CBC WITH DIFFERENTIAL (CANCER CENTER ONLY)
Abs Immature Granulocytes: 0.01 K/uL (ref 0.00–0.07)
Basophils Absolute: 0 K/uL (ref 0.0–0.1)
Basophils Relative: 1 %
Eosinophils Absolute: 0.1 K/uL (ref 0.0–0.5)
Eosinophils Relative: 2 %
HCT: 34.9 % — ABNORMAL LOW (ref 36.0–46.0)
Hemoglobin: 12.1 g/dL (ref 12.0–15.0)
Immature Granulocytes: 0 %
Lymphocytes Relative: 33 %
Lymphs Abs: 2.1 K/uL (ref 0.7–4.0)
MCH: 25.9 pg — ABNORMAL LOW (ref 26.0–34.0)
MCHC: 34.7 g/dL (ref 30.0–36.0)
MCV: 74.6 fL — ABNORMAL LOW (ref 80.0–100.0)
Monocytes Absolute: 0.6 K/uL (ref 0.1–1.0)
Monocytes Relative: 9 %
Neutro Abs: 3.6 K/uL (ref 1.7–7.7)
Neutrophils Relative %: 55 %
Platelet Count: 247 K/uL (ref 150–400)
RBC: 4.68 MIL/uL (ref 3.87–5.11)
RDW: 15.3 % (ref 11.5–15.5)
WBC Count: 6.4 K/uL (ref 4.0–10.5)
nRBC: 0 % (ref 0.0–0.2)

## 2024-02-03 LAB — CMP (CANCER CENTER ONLY)
ALT: 13 U/L (ref 0–44)
AST: 20 U/L (ref 15–41)
Albumin: 3.6 g/dL (ref 3.5–5.0)
Alkaline Phosphatase: 83 U/L (ref 38–126)
Anion gap: 8 (ref 5–15)
BUN: 8 mg/dL (ref 6–20)
CO2: 24 mmol/L (ref 22–32)
Calcium: 9 mg/dL (ref 8.9–10.3)
Chloride: 103 mmol/L (ref 98–111)
Creatinine: 0.64 mg/dL (ref 0.44–1.00)
GFR, Estimated: >60 mL/min (ref >60)
Glucose, Bld: 101 mg/dL — ABNORMAL HIGH (ref 70–99)
Potassium: 4 mmol/L (ref 3.5–5.1)
Sodium: 135 mmol/L (ref 135–145)
Total Bilirubin: 0.6 mg/dL (ref 0.0–1.2)
Total Protein: 7.5 g/dL (ref 6.5–8.1)

## 2024-02-03 LAB — FERRITIN: Ferritin: 21 ng/mL (ref 11–307)

## 2024-02-03 LAB — IRON AND TIBC
Iron: 64 ug/dL (ref 28–170)
Saturation Ratios: 21 % (ref 10.4–31.8)
TIBC: 301 ug/dL (ref 250–450)
UIBC: 237 ug/dL

## 2024-02-07 ENCOUNTER — Inpatient Hospital Stay: Payer: BC Managed Care – PPO | Admitting: Oncology

## 2024-02-07 ENCOUNTER — Encounter: Payer: Self-pay | Admitting: Oncology

## 2024-02-07 VITALS — BP 119/81 | HR 73 | Temp 97.0°F | Resp 18 | Wt 205.8 lb

## 2024-02-07 DIAGNOSIS — D500 Iron deficiency anemia secondary to blood loss (chronic): Secondary | ICD-10-CM

## 2024-02-07 DIAGNOSIS — I2699 Other pulmonary embolism without acute cor pulmonale: Secondary | ICD-10-CM

## 2024-02-07 DIAGNOSIS — D582 Other hemoglobinopathies: Secondary | ICD-10-CM | POA: Diagnosis not present

## 2024-02-07 DIAGNOSIS — R768 Other specified abnormal immunological findings in serum: Secondary | ICD-10-CM | POA: Diagnosis not present

## 2024-02-07 MED ORDER — APIXABAN 2.5 MG PO TABS: 2.5000 mg | ORAL_TABLET | Freq: Two times a day (BID) | ORAL | 0 refills | Status: DC

## 2024-02-07 NOTE — Assessment & Plan Note (Signed)
 Labs are reviewed and discussed with patient. Lab Results  Component Value Date   HGB 12.1 02/03/2024   TIBC 301 02/03/2024   IRONPCTSAT 21 02/03/2024   FERRITIN 21 02/03/2024   Recommend patient to take oral iron supplementation during menstrual period week.

## 2024-02-07 NOTE — Assessment & Plan Note (Addendum)
 unprovoked small pulmonary embolism S/p 3+ months of Eliquis  5 mg twice daily.   Hypercoagulable workup is negative including negative antiphospholipid syndrome, negative factor V Leiden mutation, normal protein C/S levels, negative beta glycoprotein antibodies, negative prothrombin gene mutation  I recommend long term Eliquis  2.5 mg twice daily for  anticoagulation prophylaxis. She declines and does not want to continue anticoagulation prophylaxis.  She declines Aspirin 81mg  daily.

## 2024-02-07 NOTE — Progress Notes (Signed)
 Hematology/Oncology Consult note [phone] [phone]         Patient Care Team: Alben Therisa MATSU, PA as PCP - General (Family Medicine) Babara Call, MD as Consulting Physician (Oncology)  CHIEF COMPLAINTS/REASON FOR VISIT:  Follow-up for pulmonary embolism  ASSESSMENT & PLAN:   Pulmonary embolus (HCC) unprovoked small pulmonary embolism S/p 3+ months of Eliquis  5 mg twice daily.   Hypercoagulable workup is negative including negative antiphospholipid syndrome, negative factor V Leiden mutation, normal protein C/S levels, negative beta glycoprotein antibodies, negative prothrombin gene mutation  I recommend long term Eliquis  2.5 mg twice daily for  anticoagulation prophylaxis. She declines and does not want to continue anticoagulation prophylaxis.  She declines Aspirin 81mg  daily.  ANA positive Possible emerging connective tissue disease however not meeting indication of starting medication.  Rheumatology recommend watchful waiting.  Hemoglobin C trait (HCC) Chronic microcytosis.   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 Suggest prenatal counseling if she is planning to start a family.  Iron deficiency anemia Labs are reviewed and discussed with patient. Lab Results  Component Value Date   HGB 12.1 02/03/2024   TIBC 301 02/03/2024   IRONPCTSAT 21 02/03/2024   FERRITIN 21 02/03/2024   Recommend patient to take oral iron supplementation during menstrual period week.   No orders of the defined types were placed in this encounter.  Follow-up in 6 months. All questions were answered. The patient knows to call the clinic with any problems, questions or concerns.  Call Babara, MD, PhD [facility] Oncology 02/07/2024   HISTORY OF PRESENTING ILLNESS:   Lisa Bray is a  33 y.o.  female with PMH listed below was seen in consultation at the request of  Alben Therisa MATSU, [place]  for evaluation of pulmonary  embolism  03/01/2022, patient felt left upper back discomfort to feel like pulled muscle.  She went to emergency room for further evaluation.  Denies any cough, fever or chills. Chest x-ray showed airspace opacity in the lingula may reflect infiltrate. CT angio chest PE protocol showed small pulmonary emboli within the lingula, associated groundglass of airspace opacity in the lingula.  Small left pleural effusion.  03/02/2022, bilateral lower extremity ultrasound negative for DVT.  Patient was started on blood thinner with Eliquis  and empiric azithromycin  and discharged home.  She went back to emergency room on 03/07/2022, for evaluation of recurrent pain which was significant as severe but from left collarbone down to her chest.  03/07/2022 repeat CT chest angiogram showed tiny nonocclusive pulmonary embolism in a lingular branch of the left pulmonary artery. Probable additional peripheral embolus in the lingula, similar to prior. No new or progressive pulmonary embolic disease evident.  Interval evolution of the lingular infarct, now with more consolidative appearance although size is similar to previous. Interval progression of left pleural effusion, now moderate in size.  Patient was seen by pulmonology for left pleural effusion, patient had point of focus ultrasound done which was reassuring the left pleural effusion mostly resolved.  Only trace left.  Patient had negative antiphospholipid antibodies, ANA was positive-SSA antibody positive.  She has been referred to both hematology and rheumatology for further evaluation.  Patient denies previous personal history of thrombosis or family history of thrombosis.  Denies any family history of cancer.  Denies any OCP use, recent immobilization events prior to diagnosis of PE.  Patient had positive ANA and was evaluated by rheumatology Dr. Tobie who feels that her clinical history is suspicious  for emerging systemic connective tissue disease although  currently she is asymptomatic, she has no symptoms for Sjogren syndrome lupus.  Dr. Tobie recommend long-term watchful waiting.-If she becomes pregnant in the future, Dr. Tobie recommend her to stop Plaquenil to reduce risk of neonatal lupus.  INTERVAL HISTORY Lisa Bray is a 33 y.o. female who has above history reviewed by me today presents for follow up visit for history of acute unprovoked pulmonary embolism. Patient reports feeling well.  She takes Eliquis  2.5 mg twice daily Denies any bleeding events.  Menstrual period is heavy.   She took oral iron supplementation     MEDICAL HISTORY:  Past Medical History:  Diagnosis Date   Neurogenic syncope    Recurrent upper respiratory infection (URI)     SURGICAL HISTORY: Past Surgical History:  Procedure Laterality Date   BREAST BIOPSY Right     SOCIAL HISTORY: Social History   Socioeconomic History   Marital status: Single    Spouse name: Not on file   Number of children: 0   Years of education: BS   Highest education level: Not on file  Occupational History   Occupation: [employer]  Tobacco Use   Smoking status: Never   Smokeless tobacco: Never  Vaping Use   Vaping status: Never Used  Substance and Sexual Activity   Alcohol use: No   Drug use: No   Sexual activity: Not on file  Other Topics Concern   Not on file  Social History Narrative   Lives at home, single   Right-handed   No caffeine    Social Drivers of Corporate investment banker Strain: Not on file  Food Insecurity: Not on file  Transportation Needs: Not on file  Physical Activity: Not on file  Stress: Not on file  Social Connections: Unknown (10/31/2021)   Received from [facility]   Social Network    Social Network: Not on file  Intimate Partner Violence: Unknown (09/24/2021)   Received from [facility]   HITS    Physically Hurt: Not on file    Insult or Talk Down To: Not on file    Threaten Physical Harm: Not on file    Scream or  Curse: Not on file    FAMILY HISTORY: Family History  Problem Relation Age of Onset   Hypertension Father    Glaucoma Father    Migraines Neg Hx     ALLERGIES:  is allergic to sulfamethoxazole-trimethoprim.  MEDICATIONS:  Current Outpatient Medications  Medication Sig Dispense Refill   benzoyl peroxide 5 % gel Apply topically 2 (two) times daily.     ferrous sulfate  325 (65 FE) MG EC tablet Take 1 tablet (325 mg total) by mouth 2 (two) times daily with a meal. 180 tablet 0   hydroquinone 4 % cream Apply topically at bedtime.     Salicylic Acid (DERMACINRX ATRIX ANTIBAC WASH) 2 % LIQD Lather this product on face and wash off thoroughly every morning and evening.     tretinoin (RETIN-A) 0.025 % cream Apply topically at bedtime.     tretinoin microspheres (RETIN-A MICRO) 0.1 % gel Apply topically at bedtime.     apixaban  (ELIQUIS ) 2.5 MG TABS tablet Take 1 tablet (2.5 mg total) by mouth 2 (two) times daily. 60 tablet 0   fluticasone  (FLONASE ) 50 MCG/ACT nasal spray Place 2 sprays into both nostrils daily. (Patient not taking: Reported on 02/07/2024)     No current facility-administered medications for this visit.    Review  of Systems  Constitutional:  Positive for fatigue. Negative for appetite change, chills and fever.  HENT:   Negative for hearing loss and voice change.   Eyes:  Negative for eye problems.  Respiratory:  Negative for chest tightness and cough.   Cardiovascular:  Negative for chest pain.  Gastrointestinal:  Negative for abdominal distention, abdominal pain and blood in stool.  Endocrine: Negative for hot flashes.  Genitourinary:  Negative for difficulty urinating and frequency.   Musculoskeletal:  Negative for arthralgias.  Skin:  Negative for itching and rash.  Neurological:  Negative for extremity weakness and light-headedness.  Hematological:  Negative for adenopathy.  Psychiatric/Behavioral:  Negative for confusion.    PHYSICAL EXAMINATION: ECOG  PERFORMANCE STATUS: 0 - Asymptomatic Vitals:   02/07/24 1037  BP: 119/81  Pulse: 73  Resp: 18  Temp: (!) 97 F (36.1 C)  SpO2: 100%   Filed Weights   02/07/24 1037  Weight: 205 lb 12.8 oz (93.4 kg)    Physical Exam Constitutional:      General: She is not in acute distress. HENT:     Head: Normocephalic and atraumatic.  Eyes:     General: No scleral icterus. Cardiovascular:     Rate and Rhythm: Normal rate and regular rhythm.  Pulmonary:     Effort: Pulmonary effort is normal. No respiratory distress.     Breath sounds: No wheezing.  Abdominal:     General: Bowel sounds are normal. There is no distension.     Palpations: Abdomen is soft.  Musculoskeletal:        General: No deformity. Normal range of motion.     Cervical back: Normal range of motion and neck supple.  Skin:    Findings: No erythema or rash.  Neurological:     Mental Status: She is alert and oriented to person, place, and time. Mental status is at baseline.  Psychiatric:        Mood and Affect: Mood normal.     LABORATORY DATA:  I have reviewed the data as listed    Latest Ref Rng & Units 02/03/2024   10:03 AM 07/09/2023    9:47 AM 04/13/2023   10:18 AM  CBC  WBC 4.0 - 10.5 K/uL 6.4  6.8  6.1   Hemoglobin 12.0 - 15.0 g/dL 87.8  87.7  87.8   Hematocrit 36.0 - 46.0 % 34.9  35.0  35.0   Platelets 150 - 400 K/uL 247  223  216       Latest Ref Rng & Units 02/03/2024   10:03 AM 07/09/2023    9:47 AM 04/13/2023   10:18 AM  CMP  Glucose 70 - 99 mg/dL 898  84  97   BUN 6 - 20 mg/dL 8  7  11    Creatinine 0.44 - 1.00 mg/dL 9.35  9.32  9.30   Sodium 135 - 145 mmol/L 135  135  136   Potassium 3.5 - 5.1 mmol/L 4.0  4.5  3.8   Chloride 98 - 111 mmol/L 103  103  104   CO2 22 - 32 mmol/L 24  24  25    Calcium 8.9 - 10.3 mg/dL 9.0  9.0  9.0   Total Protein 6.5 - 8.1 g/dL 7.5  7.3  7.3   Total Bilirubin 0.0 - 1.2 mg/dL 0.6  0.5  0.6   Alkaline Phos 38 - 126 U/L 83  77  81   AST 15 - 41 U/L 20  21  23  ALT 0 - 44 U/L 13  14  14        RADIOGRAPHIC STUDIES: I have personally reviewed the radiological images as listed and agreed with the findings in the report. No results found.

## 2024-02-07 NOTE — Assessment & Plan Note (Signed)
Chronic microcytosis.   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 Suggest prenatal counseling if she is planning to start a family.

## 2024-02-07 NOTE — Assessment & Plan Note (Signed)
Possible emerging connective tissue disease however not meeting indication of starting medication.  Rheumatology recommend watchful waiting.

## 2024-03-06 ENCOUNTER — Other Ambulatory Visit: Payer: Self-pay

## 2024-03-06 ENCOUNTER — Emergency Department
Admission: EM | Admit: 2024-03-06 | Discharge: 2024-03-06 | Disposition: A | Discharge status: Home / Self Care | Attending: Emergency Medicine | Admitting: Emergency Medicine

## 2024-03-06 ENCOUNTER — Encounter: Payer: Self-pay | Admitting: Emergency Medicine

## 2024-03-06 ENCOUNTER — Encounter: Payer: Self-pay | Admitting: Oncology

## 2024-03-06 DIAGNOSIS — R0789 Other chest pain: Secondary | ICD-10-CM | POA: Insufficient documentation

## 2024-03-06 DIAGNOSIS — R079 Chest pain, unspecified: Secondary | ICD-10-CM

## 2024-03-06 LAB — BASIC METABOLIC PANEL WITH GFR
Anion gap: 11 (ref 5–15)
BUN: 9 mg/dL (ref 6–20)
CO2: 26 mmol/L (ref 22–32)
Calcium: 9 mg/dL (ref 8.9–10.3)
Chloride: 104 mmol/L (ref 98–111)
Creatinine, Ser: 0.64 mg/dL (ref 0.44–1.00)
GFR, Estimated: >60 mL/min (ref >60)
Glucose, Bld: 98 mg/dL (ref 70–99)
Potassium: 3.9 mmol/L (ref 3.5–5.1)
Sodium: 141 mmol/L (ref 135–145)

## 2024-03-06 LAB — CBC WITH DIFFERENTIAL/PLATELET
Abs Immature Granulocytes: 0.02 K/uL (ref 0.00–0.07)
Basophils Absolute: 0 K/uL (ref 0.0–0.1)
Basophils Relative: 0 %
Eosinophils Absolute: 0.1 K/uL (ref 0.0–0.5)
Eosinophils Relative: 1 %
HCT: 34.7 % — ABNORMAL LOW (ref 36.0–46.0)
Hemoglobin: 12.1 g/dL (ref 12.0–15.0)
Immature Granulocytes: 0 %
Lymphocytes Relative: 39 %
Lymphs Abs: 2.8 K/uL (ref 0.7–4.0)
MCH: 26 pg (ref 26.0–34.0)
MCHC: 34.9 g/dL (ref 30.0–36.0)
MCV: 74.5 fL — ABNORMAL LOW (ref 80.0–100.0)
Monocytes Absolute: 0.6 K/uL (ref 0.1–1.0)
Monocytes Relative: 8 %
Neutro Abs: 3.7 K/uL (ref 1.7–7.7)
Neutrophils Relative %: 52 %
Platelets: 220 K/uL (ref 150–400)
RBC: 4.66 MIL/uL (ref 3.87–5.11)
RDW: 15.5 % (ref 11.5–15.5)
WBC: 7.2 K/uL (ref 4.0–10.5)
nRBC: 0 % (ref 0.0–0.2)

## 2024-03-06 LAB — TROPONIN I (HIGH SENSITIVITY): Troponin I (High Sensitivity): 2 ng/L (ref <18)

## 2024-03-06 LAB — D-DIMER, QUANTITATIVE: D-Dimer, Quant: 0.33 ug{FEU}/mL (ref 0.00–0.50)

## 2024-03-06 NOTE — ED Provider Notes (Signed)
 [facility] Provider Note    Event Date/Time   First MD Initiated Contact with Patient 03/06/24 [phone]     (approximate)  History   Chief Complaint: lung pain  HPI  Nico Temeka Pore is a 33 y.o. female with a past medical history of PE 2 years ago who presents to the emergency department for left-sided chest pain.  According to the patient 2 years ago patient developed a PE in her left lung.  She had a workup with hematology showing no abnormal results.  Patient has been on Eliquis  for 2 years and then recently decided 2 days ago to stop this medication.  She states yesterday she developed pain in her left chest with a deep inspiration that seems somewhat similar to her prior PE although states it was very mild.  Patient was concerned so she came to the emergency department.  Patient denies any cough congestion or fever.  Physical Exam   Triage Vital Signs: ED Triage Vitals  Encounter Vitals Group     BP 03/06/24 0935 129/85     Girls Systolic BP Percentile --      Girls Diastolic BP Percentile --      Boys Systolic BP Percentile --      Boys Diastolic BP Percentile --      Pulse Rate 03/06/24 0935 85     Resp 03/06/24 0935 16     Temp 03/06/24 0935 98.2 F (36.8 C)     Temp src --      SpO2 03/06/24 0935 100 %     Weight 03/06/24 0933 205 lb 14.6 oz (93.4 kg)     Height --      Head Circumference --      Peak Flow --      Pain Score 03/06/24 0933 0     Pain Loc --      Pain Education --      Exclude from Growth Chart --     Most recent vital signs: Vitals:   03/06/24 0935  BP: 129/85  Pulse: 85  Resp: 16  Temp: 98.2 F (36.8 C)  SpO2: 100%    General: Awake, no distress.  CV:  Good peripheral perfusion.  Regular rate and rhythm  Resp:  Normal effort.  Equal breath sounds bilaterally.  Nontender chest wall. Abd:  No distention.    ED Results / Procedures / Treatments   EKG  EKG viewed and interpreted by myself shows a normal  sinus rhythm at 72 bpm with a narrow QRS, normal axis, normal intervals, no concerning ST changes.  Reassuring EKG.   MEDICATIONS ORDERED IN ED: Medications - No data to display   IMPRESSION / MDM / ASSESSMENT AND PLAN / ED COURSE  I reviewed the triage vital signs and the nursing notes.  Patient's presentation is most consistent with acute presentation with potential threat to life or bodily function.  Patient presents to the emergency department for left-sided chest discomfort that started last night.  Patient discontinued her Eliquis  on Saturday after being on this medication for 2 years.  Patient has a history of a PE 2 years ago.  Overall the patient appears well reassuring physical exam.  Will check labs including a CBC chemistry and troponin.  I discussed with the patient obtaining a CT scan of the chest to evaluate for PE.  Patient is quite hesitant about getting a another CT as she states she has had many CT scans of the chest.  I discussed the pros and cons of a D-dimer, patient would prefer D-dimer, given her relatively low risk as she just discontinued Eliquis  on Saturday I believe a D-dimer would be adequate to help rule out PE.  If the D-dimer is negative I believe patient can follow-up with her hematologist as an outpatient.  If positive we will proceed with a CTA.  Patient agreeable to plan.  Patient's workup shows reassuring lab work with a normal CBC normal chemistry negative troponin and reassuringly negative D-dimer.  Patient very reassured by the workup.  We will discharge patient home have her follow-up with her PCP.  Discussed return precautions.  Patient agreeable to plan.  FINAL CLINICAL IMPRESSION(S) / ED DIAGNOSES   Chest pain    Note:  This document was prepared using Dragon voice recognition software and may include unintentional dictation errors.   Dorothyann Drivers, MD 03/06/24 1058

## 2024-03-06 NOTE — ED Notes (Signed)
 Se triage note  Presents with pain to right side of chest  And pain with inspiration  Hx of PE in past  Recently stopped her blood thinners

## 2024-03-06 NOTE — ED Triage Notes (Signed)
 Stopped Eliquis  2.5 mg BID on Saturday. Had a PE in the past.  PE 02/2022.  Sunday and Monday felt pain similar to PE -- left lung.  No SOB/ DOE.  AAOx3.  Skin warm and dry. NAD

## 2024-03-07 ENCOUNTER — Other Ambulatory Visit: Payer: Self-pay

## 2024-03-07 DIAGNOSIS — I2699 Other pulmonary embolism without acute cor pulmonale: Secondary | ICD-10-CM

## 2024-03-07 MED ORDER — APIXABAN 2.5 MG PO TABS
2.5000 mg | ORAL_TABLET | Freq: Two times a day (BID) | ORAL | 1 refills | Status: DC
Start: 2024-03-07 — End: 2024-03-21

## 2024-03-21 ENCOUNTER — Telehealth: Payer: Self-pay | Admitting: *Deleted

## 2024-03-21 ENCOUNTER — Other Ambulatory Visit: Payer: Self-pay

## 2024-03-21 DIAGNOSIS — I2699 Other pulmonary embolism without acute cor pulmonale: Secondary | ICD-10-CM

## 2024-03-21 MED ORDER — APIXABAN 2.5 MG PO TABS: 2.5000 mg | ORAL_TABLET | Freq: Two times a day (BID) | ORAL | 1 refills | Status: DC

## 2024-03-21 NOTE — Telephone Encounter (Signed)
 The patient said that she still does not have the [facility]quis  at the [facility].  Spoke to Brock and she says she is going to resend it and it is going to [id number] [place], [place] - [address] I called back to the patient and left her a mess message that [facility] has been [facility]  [address]  Told her if that did not come through to give us  a call

## 2024-03-23 ENCOUNTER — Ambulatory Visit
Admission: RE | Admit: 2024-03-23 | Discharge: 2024-03-23 | Disposition: A | Discharge status: Home / Self Care | Source: Ambulatory Visit | Attending: Student in an Organized Health Care Education/Training Program | Admitting: Student in an Organized Health Care Education/Training Program

## 2024-03-23 ENCOUNTER — Other Ambulatory Visit
Admission: RE | Admit: 2024-03-23 | Discharge: 2024-03-23 | Disposition: A | Source: Ambulatory Visit | Attending: Student in an Organized Health Care Education/Training Program | Admitting: Student in an Organized Health Care Education/Training Program

## 2024-03-23 ENCOUNTER — Encounter: Payer: Self-pay | Admitting: Student in an Organized Health Care Education/Training Program

## 2024-03-23 ENCOUNTER — Ambulatory Visit: Admitting: Student in an Organized Health Care Education/Training Program

## 2024-03-23 VITALS — BP 120/80 | HR 80 | Temp 98.9°F | Ht 66.0 in | Wt 209.6 lb

## 2024-03-23 DIAGNOSIS — Z86711 Personal history of pulmonary embolism: Secondary | ICD-10-CM | POA: Diagnosis present

## 2024-03-23 DIAGNOSIS — R0789 Other chest pain: Secondary | ICD-10-CM

## 2024-03-23 DIAGNOSIS — R7689 Other specified abnormal immunological findings in serum: Secondary | ICD-10-CM

## 2024-03-23 LAB — D-DIMER, QUANTITATIVE: D-Dimer, Quant: 0.48 ug{FEU}/mL (ref 0.00–0.50)

## 2024-03-23 LAB — C-REACTIVE PROTEIN: CRP: 1.8 mg/dL — ABNORMAL HIGH (ref <1.0)

## 2024-03-23 LAB — SEDIMENTATION RATE: Sed Rate: 52 mm/h — ABNORMAL HIGH (ref 0–20)

## 2024-03-23 NOTE — Progress Notes (Signed)
 Assessment & Plan:   #Chest discomfort (Primary) #Pulmonary Embolism #Positive SSA  She has a history of unprovoked pulmonary embolism with a left sided pleural effusion which has since resolved. Repeat POCUS during our previous visit was re-assuring, and this was confirmed with an MRI of the chest 09/2022 that was also without an effusion. She was seen by hematology and a hypercoagulable workup was negative. Given positive Anti-Ro (SSA) antibody she was referred to rheumatology and no further workup was deemed necessary.  Patient was on 5 mg of apixaban  bid for a year, and subsequently transitioned to 2.5 mg twice daily for a year. Upon discontinuation of apixaban , she experienced left sided chest discomfort. She was seen in the ED and a d-dimer was negative. She has restarted anti-coagulation and has felt well since.  She presents today for follow-up of above after developing chest discomfort following discontinuation of Eliquis .  Point-of-care ultrasound today for the evaluation of the left pleural space did not show any pleural effusion or subpleural consolidations.  The ultrasound showed lung sliding and normally visualized diaphragm with spleen visualized as well as kidneys.  I will reorder her D-dimer test today and also obtain a CRP/ESR to evaluate for nonspecific signs of inflammation.  We had previously sent an autoimmune panel which only showed a positive signal for SSA.  She was evaluated by rheumatology and not felt to have an autoimmune process. I will also obtain a chest xray today for completion of workup. Should ESR/CRP be significantly elevated, I will re-initiate rheumatological workup for possible pleuritis.  - D-Dimer, Quantitative; Future - C-reactive protein; Future - Sedimentation rate; Future - DG Chest 2 View; Future   Return in about 1 year (around 03/23/2025).  I spent 30 minutes caring for this patient today, including preparing to see the patient, obtaining a  medical history , reviewing a separately obtained history, performing a medically appropriate examination and/or evaluation, counseling and educating the patient/family/caregiver, ordering medications, tests, or procedures, documenting clinical information in the electronic health record, and independently interpreting results (not separately reported/billed) and communicating results to the patient/family/caregiver  Belva November, MD [facility]   End of visit medications:  No orders of the defined types were placed in this encounter.    Current Outpatient Medications:    apixaban  (ELIQUIS ) 2.5 MG TABS tablet, Take 1 tablet (2.5 mg total) by mouth 2 (two) times daily., Disp: 90 tablet, Rfl: 1   benzoyl peroxide 5 % gel, Apply topically 2 (two) times daily., Disp: , Rfl:    ferrous sulfate  325 (65 FE) MG EC tablet, Take 1 tablet (325 mg total) by mouth 2 (two) times daily with a meal., Disp: 180 tablet, Rfl: 0   hydroquinone 4 % cream, Apply topically at bedtime., Disp: , Rfl:    Salicylic Acid (DERMACINRX ATRIX ANTIBAC WASH) 2 % LIQD, Lather this product on face and wash off thoroughly every morning and evening., Disp: , Rfl:    tretinoin (RETIN-A) 0.025 % cream, Apply topically at bedtime., Disp: , Rfl:    tretinoin microspheres (RETIN-A MICRO) 0.1 % gel, Apply topically at bedtime., Disp: , Rfl:    fluticasone  (FLONASE ) 50 MCG/ACT nasal spray, Place 2 sprays into both nostrils daily. (Patient not taking: Reported on 03/23/2024), Disp: , Rfl:    Subjective:   PATIENT ID: [name] GENDER: female DOB: October 03, 1990, MRN: [id number]  Chief Complaint  Patient presents with   Consult    Chest discomfort, hx of pleural effusion and  acute PE  This week has been good, stopped taking her blood thinner 913, and 9/14 had felt pain and deep breaths were painful, then on the 9/15 went to the ER to be checked and was cleared since her d-dimer was ok. And since which  restarted her blood thinners which has made her feel better and currently no pain. Unsure if this is just scar tissue or was it a possible PE (?)    HPI  Lisa Bray is a 33 year old female with a diagnosis of unprovoked PE and associated small left sided pleural effusion presenting for follow up.   She was first seen in the ED on 03/01/2022 for chest pain and worked up with a CT chest with PE protocol showed a left sided pleural effusion in addition to a small lingular pulmonary embolus. She was started on Apixaban  and discharged. She re-presented on 03/07/2022 with left sided shoulder pain. Repeat CT chest showed small lingular pulmonary embolus and a growth in the size of the pleural effusion.   At the time of my initial evaluation, her symptoms were beginning to improve. She is a non-smoker, and denies vaping. She is not on OCP or other hormonal therapy. She reports no personal or family history of auto-immune disorders, and denies joint pains, rashes, raynaud's phenomena, difficulty swallowing, or personal history of blood clots. There is no family history of repeat miscarriages.  Return visit 03/23/2024:  She was doing well up until mid September 2025 when she stopped taking her Eliquis .  She started experiencing left-sided pleuritic chest pain which was made worse when bending forward. This lasted a few days prompting a visit to the emergency department.  She was seen in the ED where a D-dimer test was negative and CT/PE was deferred.  She restarted her Eliquis  and her symptoms have resolved.  Prior to this, she received 1 year of low-dose DOAC therapy and 1 year of half dose DOAC therapy.  She has not had any other symptoms in the interim.  No shortness of breath is reported and currently she denies any chest pain.  She does report that her menses is quite heavy.  Ancillary information including prior medications, full medical/surgical/family/social histories, and PFTs (when available) are listed  below and have been reviewed.    Review of Systems  Constitutional:  Negative for chills, fever, malaise/fatigue and weight loss.  Respiratory:  Negative for cough, hemoptysis and sputum production.   Cardiovascular:  Negative for chest pain, leg swelling and PND.  Gastrointestinal:  Negative for heartburn and nausea.  Neurological:  Negative for dizziness and focal weakness.     Objective:   Vitals:   03/23/24 0912  BP: 120/80  Pulse: 80  Temp: 98.9 F (37.2 C)  SpO2: 100%  Weight: 209 lb 9.6 oz (95.1 kg)  Height: 5' 6 (1.676 m)   100% on RA  BMI Readings from Last 3 Encounters:  03/23/24 33.83 kg/m  03/06/24 33.23 kg/m  02/07/24 33.22 kg/m   Wt Readings from Last 3 Encounters:  03/23/24 209 lb 9.6 oz (95.1 kg)  03/06/24 205 lb 14.6 oz (93.4 kg)  02/07/24 205 lb 12.8 oz (93.4 kg)    Physical Exam Constitutional:      Appearance: Normal appearance. She is not ill-appearing.  HENT:     Head: Normocephalic and atraumatic.     Mouth/Throat:     Mouth: Mucous membranes are moist.  Cardiovascular:     Rate and Rhythm: Normal rate and regular rhythm.  Pulses: Normal pulses.     Heart sounds: Normal heart sounds.  Pulmonary:     Effort: Pulmonary effort is normal.     Breath sounds: Normal breath sounds.  Abdominal:     Palpations: Abdomen is soft.     Tenderness: There is no abdominal tenderness.  Musculoskeletal:     Right lower leg: No edema.     Left lower leg: No edema.  Neurological:     General: No focal deficit present.     Mental Status: She is alert and oriented to person, place, and time. Mental status is at baseline.       Ancillary Information    Past Medical History:  Diagnosis Date   Neurogenic syncope    Pulmonary embolism (HCC) 02/2022   Recurrent upper respiratory infection (URI)      Family History  Problem Relation Age of Onset   Hypertension Father    Glaucoma Father    Migraines Neg Hx      Past Surgical History:   Procedure Laterality Date   BREAST BIOPSY Right     Social History   Socioeconomic History   Marital status: Single    Spouse name: Not on file   Number of children: 0   Years of education: BS   Highest education level: Not on file  Occupational History   Occupation: [employer]  Tobacco Use   Smoking status: Never   Smokeless tobacco: Never  Vaping Use   Vaping status: Never Used  Substance and Sexual Activity   Alcohol use: No   Drug use: No   Sexual activity: Yes  Other Topics Concern   Not on file  Social History Narrative   Lives at home, single   Right-handed   No caffeine    Social Drivers of Corporate investment banker Strain: Not on file  Food Insecurity: Not on file  Transportation Needs: Not on file  Physical Activity: Not on file  Stress: Not on file  Social Connections: Unknown (10/31/2021)   Received from [facility]   Social Network    Social Network: Not on file  Intimate Partner Violence: Unknown (09/24/2021)   Received from [facility]   HITS    Physically Hurt: Not on file    Insult or Talk Down To: Not on file    Threaten Physical Harm: Not on file    Scream or Curse: Not on file     Allergies  Allergen Reactions   Sulfamethoxazole-Trimethoprim Nausea Only     CBC    Component Value Date/Time   WBC 7.2 03/06/2024 0935   RBC 4.66 03/06/2024 0935   HGB 12.1 03/06/2024 0935   HGB 12.1 02/03/2024 1003   HCT 34.7 (L) 03/06/2024 0935   PLT 220 03/06/2024 0935   PLT 247 02/03/2024 1003   MCV 74.5 (L) 03/06/2024 0935   MCH 26.0 03/06/2024 0935   MCHC 34.9 03/06/2024 0935   RDW 15.5 03/06/2024 0935   LYMPHSABS 2.8 03/06/2024 0935   MONOABS 0.6 03/06/2024 0935   EOSABS 0.1 03/06/2024 0935   BASOSABS 0.0 03/06/2024 0935    Pulmonary Functions Testing Results:     No data to display          Outpatient Medications Prior to Visit  Medication Sig Dispense Refill   apixaban  (ELIQUIS ) 2.5 MG TABS tablet Take 1 tablet (2.5 mg  total) by mouth 2 (two) times daily. 90 tablet 1   benzoyl peroxide 5 % gel Apply topically 2 (two)  times daily.     ferrous sulfate  325 (65 FE) MG EC tablet Take 1 tablet (325 mg total) by mouth 2 (two) times daily with a meal. 180 tablet 0   hydroquinone 4 % cream Apply topically at bedtime.     Salicylic Acid (DERMACINRX ATRIX ANTIBAC WASH) 2 % LIQD Lather this product on face and wash off thoroughly every morning and evening.     tretinoin (RETIN-A) 0.025 % cream Apply topically at bedtime.     tretinoin microspheres (RETIN-A MICRO) 0.1 % gel Apply topically at bedtime.     fluticasone  (FLONASE ) 50 MCG/ACT nasal spray Place 2 sprays into both nostrils daily. (Patient not taking: Reported on 03/23/2024)     No facility-administered medications prior to visit.

## 2024-03-30 ENCOUNTER — Other Ambulatory Visit: Payer: Self-pay

## 2024-03-30 ENCOUNTER — Ambulatory Visit: Payer: Self-pay | Admitting: Student in an Organized Health Care Education/Training Program

## 2024-03-30 DIAGNOSIS — R0789 Other chest pain: Secondary | ICD-10-CM

## 2024-03-30 DIAGNOSIS — Z86711 Personal history of pulmonary embolism: Secondary | ICD-10-CM

## 2024-03-30 NOTE — Progress Notes (Signed)
 Called and LVM to check her mychart messages and to call back if she has any further questions regarding her recent blood work.

## 2024-03-30 NOTE — Progress Notes (Signed)
 2 week future order placed for repeat labs, will contact pt to alert her about upcoming blood work.

## 2024-04-15 ENCOUNTER — Other Ambulatory Visit
Admission: RE | Admit: 2024-04-15 | Discharge: 2024-04-15 | Disposition: A | Discharge status: Home / Self Care | Attending: Student in an Organized Health Care Education/Training Program | Admitting: Student in an Organized Health Care Education/Training Program

## 2024-04-15 DIAGNOSIS — I2699 Other pulmonary embolism without acute cor pulmonale: Secondary | ICD-10-CM | POA: Diagnosis present

## 2024-04-15 DIAGNOSIS — R0789 Other chest pain: Secondary | ICD-10-CM | POA: Diagnosis present

## 2024-04-15 DIAGNOSIS — Z86711 Personal history of pulmonary embolism: Secondary | ICD-10-CM | POA: Insufficient documentation

## 2024-04-15 LAB — CBC WITH DIFFERENTIAL (CANCER CENTER ONLY)
Abs Immature Granulocytes: 0.01 K/uL (ref 0.00–0.07)
Basophils Absolute: 0 K/uL (ref 0.0–0.1)
Basophils Relative: 1 %
Eosinophils Absolute: 0.1 K/uL (ref 0.0–0.5)
Eosinophils Relative: 2 %
HCT: 34.1 % — ABNORMAL LOW (ref 36.0–46.0)
Hemoglobin: 12 g/dL (ref 12.0–15.0)
Immature Granulocytes: 0 %
Lymphocytes Relative: 41 %
Lymphs Abs: 2.7 K/uL (ref 0.7–4.0)
MCH: 25.7 pg — ABNORMAL LOW (ref 26.0–34.0)
MCHC: 35.2 g/dL (ref 30.0–36.0)
MCV: 73 fL — ABNORMAL LOW (ref 80.0–100.0)
Monocytes Absolute: 0.5 K/uL (ref 0.1–1.0)
Monocytes Relative: 7 %
Neutro Abs: 3.3 K/uL (ref 1.7–7.7)
Neutrophils Relative %: 49 %
Platelet Count: 239 K/uL (ref 150–400)
RBC: 4.67 MIL/uL (ref 3.87–5.11)
RDW: 15.2 % (ref 11.5–15.5)
WBC Count: 6.5 K/uL (ref 4.0–10.5)
nRBC: 0 % (ref 0.0–0.2)

## 2024-04-15 LAB — SEDIMENTATION RATE: Sed Rate: 52 mm/h — ABNORMAL HIGH (ref 0–20)

## 2024-04-15 LAB — C-REACTIVE PROTEIN: CRP: 0.9 mg/dL (ref <1.0)

## 2024-04-15 LAB — CMP (CANCER CENTER ONLY)
ALT: 14 U/L (ref 0–44)
AST: 23 U/L (ref 15–41)
Albumin: 3.7 g/dL (ref 3.5–5.0)
Alkaline Phosphatase: 84 U/L (ref 38–126)
Anion gap: 8 (ref 5–15)
BUN: 8 mg/dL (ref 6–20)
CO2: 26 mmol/L (ref 22–32)
Calcium: 8.9 mg/dL (ref 8.9–10.3)
Chloride: 102 mmol/L (ref 98–111)
Creatinine: 0.51 mg/dL (ref 0.44–1.00)
GFR, Estimated: >60 mL/min (ref >60)
Glucose, Bld: 90 mg/dL (ref 70–99)
Potassium: 4.2 mmol/L (ref 3.5–5.1)
Sodium: 136 mmol/L (ref 135–145)
Total Bilirubin: 0.6 mg/dL (ref 0.0–1.2)
Total Protein: 7.6 g/dL (ref 6.5–8.1)

## 2024-04-17 ENCOUNTER — Ambulatory Visit: Payer: Self-pay | Admitting: Student in an Organized Health Care Education/Training Program

## 2024-06-14 ENCOUNTER — Other Ambulatory Visit: Payer: Self-pay | Admitting: Oncology

## 2024-06-14 DIAGNOSIS — I2699 Other pulmonary embolism without acute cor pulmonale: Secondary | ICD-10-CM

## 2024-06-19 ENCOUNTER — Other Ambulatory Visit: Payer: Self-pay | Admitting: Nurse Practitioner

## 2024-06-19 DIAGNOSIS — N9489 Other specified conditions associated with female genital organs and menstrual cycle: Secondary | ICD-10-CM

## 2024-09-04 ENCOUNTER — Ambulatory Visit: Admitting: Oncology

## 2024-09-04 ENCOUNTER — Other Ambulatory Visit
